# Patient Record
Sex: Female | Born: 1950 | Race: White | Marital: Married
Health system: Southern US, Community
[De-identification: ages and names within clinical notes are randomized; demographics above are authoritative.]

---

## 2018-07-05 ENCOUNTER — Ambulatory Visit (HOSPITAL_COMMUNITY): Payer: Medicare Other | Attending: Orthopedic Surgery

## 2018-07-05 ENCOUNTER — Other Ambulatory Visit: Payer: Self-pay

## 2018-07-05 ENCOUNTER — Encounter (HOSPITAL_COMMUNITY): Payer: Self-pay

## 2018-07-05 DIAGNOSIS — M25661 Stiffness of right knee, not elsewhere classified: Secondary | ICD-10-CM | POA: Diagnosis present

## 2018-07-05 DIAGNOSIS — M6281 Muscle weakness (generalized): Secondary | ICD-10-CM | POA: Diagnosis present

## 2018-07-05 DIAGNOSIS — R262 Difficulty in walking, not elsewhere classified: Secondary | ICD-10-CM | POA: Diagnosis present

## 2018-07-05 DIAGNOSIS — R600 Localized edema: Secondary | ICD-10-CM | POA: Diagnosis present

## 2018-07-05 NOTE — Patient Instructions (Signed)
Access Code: [email or web address]  URL: [email or web address]  Date: 07/05/2018  Prepared by: Jac Canavan   Exercises Supine Quadricep Sets - 10 reps - 3 sets - 5-10seconds hold - 1-2x daily - 7x weekly Supine Heel Slide with Strap - 10 reps - 3 sets - 5-10seconds hold                            - 1-2x daily - 7x weekly

## 2018-07-05 NOTE — Therapy (Signed)
[facility] [address] [place], [place], [address] Phone: [phone]   Fax:  [phone]  Physical Therapy Evaluation  Patient Details  Name: Angela Larsen MRN: [id number] Date of Birth: [birth year] Referring Provider (PT): Sherlyn Lick, MD   Encounter Date: 07/05/2018  PT End of Session - 07/05/18 1606    Visit Number  1    Number of Visits  19    Date for PT Re-Evaluation  08/16/18   mini reassess 07/26/18   Authorization Type  [employer] Medicare    Authorization Time Period  07/05/18 to 08/16/18    Authorization - Visit Number  1    Authorization - Number of Visits  10    PT Start Time  1430    PT Stop Time  1510    PT Time Calculation (min)  40 min    Activity Tolerance  Patient tolerated treatment well    Behavior During Therapy  [facility] for tasks assessed/performed       History reviewed. No pertinent past medical history.  History reviewed. No pertinent surgical history.  There were no vitals filed for this visit.   Subjective Assessment - 07/05/18 1436    Subjective  Pt reports undergoing R TKA on 06/06/18. She had HHPT which ended on 06/30/18. She reports using a RW since August s/p fall at church, but prior to that she was not using an AD for ambulation. She reports having the most difficulty with the stiffness. She states that it is very tight in her thing and at the knee joint. Her pain is pretty well-managed. She has difficulty sitting and standing for long periods of time becuase it will get weak on her.     Limitations  Walking;Standing;Sitting    How long can you sit comfortably?  30-40 mins    How long can you stand comfortably?  not very long    How long can you walk comfortably?  ~10 mins    Currently in Pain?  No/denies         [facility] PT Assessment - 07/05/18 0001      Assessment   Medical Diagnosis  R TKA    Referring Provider (PT)  Sherlyn Lick, MD    Onset Date/Surgical Date  06/06/18    Next MD Visit   09/08/2018    Prior Therapy  HHPT ended on 06/30/18      Balance Screen   Has the patient fallen in the past 6 months  Yes    How many times?  1   when R knee gave out and she fell at church   Has the patient had a decrease in activity level because of a fear of falling?   No    Is the patient reluctant to leave their home because of a fear of falling?   No      Prior Function   Level of Independence  Independent    Vocation  Retired    Leisure  word finds, church      Observation/Other Assessments   Focus on Therapeutic Outcomes (FOTO)   42% limitation      Observation/Other Assessments-Edema    Edema  Circumferential      Circumferential Edema   Circumferential - Right  45.5cm, joint line    Circumferential - Left   41.5cm, joint line      ROM / Strength   AROM / PROM / Strength  AROM;Strength      AROM  AROM Assessment Site  Knee    Right/Left Knee  Right    Right Knee Extension  9    Right Knee Flexion  96      Strength   Strength Assessment Site  Hip;Knee;Ankle    Right Hip Flexion  3-/5    Right Hip Extension  4-/5   based on functional mobility   Right Hip ABduction  2+/5    Left Hip Flexion  4-/5    Left Hip Extension  4-/5   based on functional assessment   Left Hip ABduction  3+/5    Right Knee Flexion  4/5   sitting   Right Knee Extension  3-/5    Left Knee Flexion  5/5   sitting   Left Knee Extension  4/5    Right Ankle Dorsiflexion  4/5    Left Ankle Dorsiflexion  4+/5      Palpation   Patella mobility  sup/inf hypomobile    Palpation comment  increased restrictions and tenderness along scar and distal quad      Ambulation/Gait   Ambulation Distance (Feet)  426 Feet     Assistive device  Rolling walker    Gait Pattern  Step-through pattern;Decreased stance time - right;Decreased step length - left;Decreased hip/knee flexion - right;Decreased dorsiflexion - right;Antalgic;Trendelenburg      Balance   Balance Assessed  Yes      Static  Standing Balance   Static Standing - Balance Support  No upper extremity supported    Static Standing Balance -  Activities   Single Leg Stance - Right Leg;Single Leg Stance - Left Leg    Static Standing - Comment/# of Minutes  R: 0sec L:4sec or <      Standardized Balance Assessment   Standardized Balance Assessment  Five Times Sit to Stand    Five times sit to stand comments   25sec, 2 bouts of R knee buckle           Objective measurements completed on examination: See above findings.        PT Education - 07/05/18 1606    Education Details  exam findings, POC, HEP    Person(s) Educated  Patient;Spouse    Methods  Explanation;Demonstration;Handout    Comprehension  Verbalized understanding       PT Short Term Goals - 07/05/18 1620      PT SHORT TERM GOAL #1   Title  Pt will have decreased R knee edema at joint line by 2cm or > to decrease pain and improve ROM.    Time  3    Period  Weeks    Status  New    Target Date  07/26/18      PT SHORT TERM GOAL #2   Title  Pt will have improved R knee AROM from 5-105deg in order to maximize gait and reduce pain.    Time  3    Period  Weeks    Status  New      PT SHORT TERM GOAL #4   Title  Pt will be able to perform bil SLS for 5 sec without UE support to demo improved functional strength and to maximize gait.    Time  6    Period  Weeks    Status  New      PT SHORT TERM GOAL #5   Title  Pt will be able to perform 5xSTS in 15sec or < without UE support and without R  knee buckling to demo improved balance and functional strength.    Time  3    Period  Weeks    Status  New        PT Long Term Goals - 07/05/18 1620      PT LONG TERM GOAL #1   Title  Pt will have improved R knee AROM from 0-115deg in order to further maximize gait and stair ambulation.     Time  6    Period  Weeks    Status  New    Target Date  08/16/18      PT LONG TERM GOAL #2   Title  Pt will have improved MMT to at least 4/5 or better  throughout in order to maximize gait and balance.     Time  6    Period  Weeks    Status  New      PT LONG TERM GOAL #3   Title  Pt will be able to perform bil SLS for 10sec or > without UE support to further demo improved functional strength to maximize gait on uneven ground and stair ambulation.     Time  6    Period  Weeks    Status  New      PT LONG TERM GOAL #4   Title  Pt will have 151ft improvement in with LRAD and gait WFL in order to demo improved functional strength and mobility in order to maximize community ambulation, access, and promote return to PLOF.     Time  6    Period  Weeks    Status  New             Plan - 07/05/18 1607    Clinical Impression Statement  Pt is pleasant 67YO F who presents to OPPT s/p R TKA by Dr. Sherlyn Lick on 06/06/18. She was d/c from HHPT on 06/30/18. She currently present with post-op deficits in edema, ROM, MMT, functional strength, balance, gait, and functional mobility. Pt with 2 episodes of R knee buckling during 5xSTS but she was able to maintain balance independently. She was noted to have ~4cm swelling joint line. Her AROM was 9-96deg this date. Scar appeared well-healing with a few scabs along scar, but no overt signs of infection. Pt needs skilled PT intervention to address these impairments in order to improve ROM and maximize return to PLOF.     History and Personal Factors relevant to plan of care:  h/o L TKA 01/2017    Clinical Presentation  Stable    Clinical Presentation due to:  see flowsheets for objective tests and measures and their outcomes    Clinical Decision Making  Low    Rehab Potential  Good    PT Frequency  3x / week    PT Duration  6 weeks    PT Treatment/Interventions  ADLs/Self Care Home Management;Aquatic Therapy;Cryotherapy;Electrical Stimulation;Moist Heat;Ultrasound;DME Instruction;Gait training;Stair training;Functional mobility training;Therapeutic activities;Therapeutic exercise;Balance  training;Neuromuscular re-education;Patient/family education;Manual techniques;Scar mobilization;Passive range of motion;Dry needling;Energy conservation;Taping;Spinal Manipulations;Joint Manipulations    PT Next Visit Plan  review goals, initially focus on edema and ROM prior to heavy strengthening    PT Home Exercise Plan  eval: quad sets, heel slides    Consulted and Agree with Plan of Care  Patient;Family member/caregiver    Family Member Consulted  husband       Patient will benefit from skilled therapeutic intervention in order to improve the following deficits and impairments:  Decreased activity  tolerance, Abnormal gait, Decreased balance, Decreased endurance, Decreased mobility, Decreased range of motion, Decreased scar mobility, Decreased strength, Difficulty walking, Hypomobility, Increased edema, Increased fascial restricitons, Increased muscle spasms, Impaired flexibility, Improper body mechanics, Pain  Visit Diagnosis: Stiffness of right knee, not elsewhere classified  Muscle weakness (generalized)  Localized edema  Difficulty in walking, not elsewhere classified     Problem List There are no active problems to display for this patient.      Jac Canavan PT, DPT  [facility] [address] [place], [place], [address] Phone: [phone]   Fax:  [phone]  Name: KAMBREA CARRASCO MRN: [id number] Date of Birth: 03-Dec-1950

## 2018-07-06 ENCOUNTER — Encounter (HOSPITAL_COMMUNITY): Payer: Self-pay

## 2018-07-06 ENCOUNTER — Ambulatory Visit (HOSPITAL_COMMUNITY): Payer: Medicare Other

## 2018-07-06 DIAGNOSIS — R262 Difficulty in walking, not elsewhere classified: Secondary | ICD-10-CM

## 2018-07-06 DIAGNOSIS — M6281 Muscle weakness (generalized): Secondary | ICD-10-CM

## 2018-07-06 DIAGNOSIS — R600 Localized edema: Secondary | ICD-10-CM

## 2018-07-06 DIAGNOSIS — M25661 Stiffness of right knee, not elsewhere classified: Secondary | ICD-10-CM

## 2018-07-06 NOTE — Addendum Note (Signed)
Addended by: Jerl Santos on: 07/06/2018 04:16 PM   Modules accepted: Orders

## 2018-07-06 NOTE — Therapy (Signed)
[facility] [address] [place], [place], [address] Phone: [phone]   Fax:  [phone]  Physical Therapy Treatment  Patient Details  Name: Angela Larsen MRN: [id number] Date of Birth: 1950/12/21 Referring Provider (PT): Sherlyn Lick, MD   Encounter Date: 07/06/2018  PT End of Session - 07/06/18 1258    Visit Number  2    Number of Visits  19    Date for PT Re-Evaluation  08/16/18   mini reassess 07/26/18   Authorization Type  [employer]HC Medicare    Authorization Time Period  07/05/18 to 08/16/18    Authorization - Visit Number  2    Authorization - Number of Visits  10    PT Start Time  1300    PT Stop Time  1342    PT Time Calculation (min)  42 min    Activity Tolerance  Patient tolerated treatment well    Behavior During Therapy  [facility] for tasks assessed/performed       History reviewed. No pertinent past medical history.  History reviewed. No pertinent surgical history.  There were no vitals filed for this visit.  Subjective Assessment - 07/06/18 1259    Subjective  Pt reports compliance with her HEP 2x yesterday. She states that she is not in any pain right now.     Limitations  Walking;Standing;Sitting    How long can you sit comfortably?  30-40 mins    How long can you stand comfortably?  not very long    How long can you walk comfortably?  ~10 mins    Currently in Pain?  No/denies            [facility] Adult PT Treatment/Exercise - 07/06/18 0001      Exercises   Exercises  Knee/Hip      Knee/Hip Exercises: Stretches   Passive Hamstring Stretch  Left;3 reps;30 seconds    Passive Hamstring Stretch Limitations  supine      Knee/Hip Exercises: Seated   Long Arc Quad  Right;2 sets;10 reps    Heel Slides  Right;10 reps    Heel Slides Limitations  3-5" holds      Knee/Hip Exercises: Supine   Quad Sets  Right;15 reps    Quad Sets Limitations  3" holds    Short Arc Quad Sets  Right;2 sets;10 reps    Short Arc Quad  Sets Limitations  3" holds    Heel Slides  Right;15 reps    Heel Slides Limitations  3" holds    Knee Extension Limitations  5    Knee Flexion Limitations  103      Manual Therapy   Manual Therapy  Edema management    Manual therapy comments  separate rest of treatment    Edema Management  retro massage BLE elevated +ankle pumps for reduced edema            PT Education - 07/06/18 1259    Education Details  reviewed goals, exercise technique, continue HEP    Person(s) Educated  Patient;Spouse    Methods  Explanation;Demonstration    Comprehension  Verbalized understanding;Returned demonstration       PT Short Term Goals - 07/05/18 1620      PT SHORT TERM GOAL #1   Title  Pt will have decreased R knee edema at joint line by 2cm or > to decrease pain and improve ROM.    Time  3    Period  Weeks  Status  New    Target Date  07/26/18      PT SHORT TERM GOAL #2   Title  Pt will have improved R knee AROM from 5-105deg in order to maximize gait and reduce pain.    Time  3    Period  Weeks    Status  New      PT SHORT TERM GOAL #4   Title  Pt will be able to perform bil SLS for 5 sec without UE support to demo improved functional strength and to maximize gait.    Time  6    Period  Weeks    Status  New      PT SHORT TERM GOAL #5   Title  Pt will be able to perform 5xSTS in 15sec or < without UE support and without R knee buckling to demo improved balance and functional strength.    Time  3    Period  Weeks    Status  New        PT Long Term Goals - 07/05/18 1620      PT LONG TERM GOAL #1   Title  Pt will have improved R knee AROM from 0-115deg in order to further maximize gait and stair ambulation.     Time  6    Period  Weeks    Status  New    Target Date  08/16/18      PT LONG TERM GOAL #2   Title  Pt will have improved MMT to at least 4/5 or better throughout in order to maximize gait and balance.     Time  6    Period  Weeks    Status  New      PT  LONG TERM GOAL #3   Title  Pt will be able to perform bil SLS for 10sec or > without UE support to further demo improved functional strength to maximize gait on uneven ground and stair ambulation.     Time  6    Period  Weeks    Status  New      PT LONG TERM GOAL #4   Title  Pt will have 18ft improvement in with LRAD and gait WFL in order to demo improved functional strength and mobility in order to maximize community ambulation, access, and promote return to PLOF.     Time  6    Period  Weeks    Status  New            Plan - 07/06/18 1343    Clinical Impression Statement  Began session by reviewing goals with no f/u questions afterwards. Rest of session focused on ROM and reducing edema. Min cues for therex but overall, pt tolerating well with no increases in pain. She was challenged with SAQ indicating increased quad weakness. AROM 5 to 103deg. Continue as planned, progressing as able.     Rehab Potential  Good    PT Frequency  3x / week    PT Duration  6 weeks    PT Treatment/Interventions  ADLs/Self Care Home Management;Aquatic Therapy;Cryotherapy;Electrical Stimulation;Moist Heat;Ultrasound;DME Instruction;Gait training;Stair training;Functional mobility training;Therapeutic activities;Therapeutic exercise;Balance training;Neuromuscular re-education;Patient/family education;Manual techniques;Scar mobilization;Passive range of motion;Dry needling;Energy conservation;Taping;Spinal Manipulations;Joint Manipulations    PT Next Visit Plan  continue focus on edema and ROM prior to heavy strengthening    PT Home Exercise Plan  eval: quad sets, heel slides    Consulted and Agree with Plan of Care  Patient;Family  member/caregiver    Family Member Consulted  husband       Patient will benefit from skilled therapeutic intervention in order to improve the following deficits and impairments:  Decreased activity tolerance, Abnormal gait, Decreased balance, Decreased endurance, Decreased  mobility, Decreased range of motion, Decreased scar mobility, Decreased strength, Difficulty walking, Hypomobility, Increased edema, Increased fascial restricitons, Increased muscle spasms, Impaired flexibility, Improper body mechanics, Pain  Visit Diagnosis: Stiffness of right knee, not elsewhere classified  Muscle weakness (generalized)  Localized edema  Difficulty in walking, not elsewhere classified     Problem List There are no active problems to display for this patient.       Jac Canavan PT, DPT  [facility] [address] [place], [place], [address] Phone: [phone]   Fax:  [phone]  Name: Angela Larsen MRN: [id number] Date of Birth: [birth year]

## 2018-07-11 ENCOUNTER — Encounter (HOSPITAL_COMMUNITY): Payer: Self-pay

## 2018-07-11 ENCOUNTER — Ambulatory Visit (HOSPITAL_COMMUNITY): Payer: Medicare Other | Attending: Orthopedic Surgery

## 2018-07-11 DIAGNOSIS — R262 Difficulty in walking, not elsewhere classified: Secondary | ICD-10-CM | POA: Diagnosis present

## 2018-07-11 DIAGNOSIS — M25661 Stiffness of right knee, not elsewhere classified: Secondary | ICD-10-CM | POA: Insufficient documentation

## 2018-07-11 DIAGNOSIS — R600 Localized edema: Secondary | ICD-10-CM | POA: Diagnosis present

## 2018-07-11 DIAGNOSIS — M6281 Muscle weakness (generalized): Secondary | ICD-10-CM | POA: Insufficient documentation

## 2018-07-11 NOTE — Therapy (Signed)
[facility] [address] [place], [place], [address] Phone: [phone]   Fax:  [phone]  Physical Therapy Treatment  Patient Details  Name: Angela Larsen MRN: [id number] Date of Birth: Sep 14, 1950 Referring Provider (PT): Sherlyn Lick, MD   Encounter Date: 07/11/2018  PT End of Session - 07/11/18 1440    Visit Number  3    Number of Visits  19    Date for PT Re-Evaluation  08/16/18   Minireassess 07/26/18   Authorization Type  [employer]HC Medicare    Authorization Time Period  07/05/18 to 08/16/18    Authorization - Visit Number  3    Authorization - Number of Visits  10    PT Start Time  1430   3' on bike, not included wiht charges   PT Stop Time  1512    PT Time Calculation (min)  42 min    Activity Tolerance  Patient tolerated treatment well;No increased pain    Behavior During Therapy  [facility] for tasks assessed/performed       History reviewed. No pertinent past medical history.  History reviewed. No pertinent surgical history.  There were no vitals filed for this visit.  Subjective Assessment - 07/11/18 1434    Subjective  Pt stated knee is feeling good today, no reports of pain just stiffness.  Continues to have swelling and stiffness.  Reports compliance iwth HEP daily.    Currently in Pain?  No/denies                       [facility] Adult PT Treatment/Exercise - 07/11/18 0001      Exercises   Exercises  Knee/Hip      Knee/Hip Exercises: Stretches   Passive Hamstring Stretch  Left;3 reps;30 seconds    Passive Hamstring Stretch Limitations  supine    Knee: Self-Stretch to increase Flexion  5 reps;10 seconds    Knee: Self-Stretch Limitations  knee drive on 8in step 5x 10" holds      Knee/Hip Exercises: Aerobic   Stationary Bike  3' full revolution seat 9      Knee/Hip Exercises: Seated   Long Arc Quad  Right;15 reps    Long Arc Quad Limitations  3-5" holds    Sit to [employer]  without UE support;10 reps       Knee/Hip Exercises: Supine   [employer]  Right;15 reps    [employer] Limitations  3" holds    Short Arc [employer]  15 reps    Short Arc Quad Sets Limitations  5" holds    Heel Slides  Right;10 reps    Straight Leg Raises  AAROM;Right;10 reps    Straight Leg Raises Limitations  AA for extension lag    Knee Extension  AROM    Knee Extension Limitations  5    Knee Flexion  AROM    Knee Flexion Limitations  110   was 103     Manual Therapy   Manual Therapy  Edema management    Manual therapy comments  separate rest of treatment    Edema Management  retro massage BLE elevated +ankle pumps for reduced edema               PT Short Term Goals - 07/05/18 1620      PT SHORT TERM GOAL #1   Title  Pt will have decreased R knee edema at joint line by 2cm or > to decrease  pain and improve ROM.    Time  3    Period  Weeks    Status  New    Target Date  07/26/18      PT SHORT TERM GOAL #2   Title  Pt will have improved R knee AROM from 5-105deg in order to maximize gait and reduce pain.    Time  3    Period  Weeks    Status  New      PT SHORT TERM GOAL #4   Title  Pt will be able to perform bil SLS for 5 sec without UE support to demo improved functional strength and to maximize gait.    Time  6    Period  Weeks    Status  New      PT SHORT TERM GOAL #5   Title  Pt will be able to perform 5xSTS in 15sec or < without UE support and without R knee buckling to demo improved balance and functional strength.    Time  3    Period  Weeks    Status  New        PT Long Term Goals - 07/05/18 1620      PT LONG TERM GOAL #1   Title  Pt will have improved R knee AROM from 0-115deg in order to further maximize gait and stair ambulation.     Time  6    Period  Weeks    Status  New    Target Date  08/16/18      PT LONG TERM GOAL #2   Title  Pt will have improved MMT to at least 4/5 or better throughout in order to maximize gait and balance.     Time  6    Period  Weeks    Status   New      PT LONG TERM GOAL #3   Title  Pt will be able to perform bil SLS for 10sec or > without UE support to further demo improved functional strength to maximize gait on uneven ground and stair ambulation.     Time  6    Period  Weeks    Status  New      PT LONG TERM GOAL #4   Title  Pt will have 130ft improvement in with LRAD and gait WFL in order to demo improved functional strength and mobility in order to maximize community ambulation, access, and promote return to PLOF.     Time  6    Period  Weeks    Status  New            Plan - 07/11/18 1514    Clinical Impression Statement  Session focus on knee mobility.  Added bike with ability to make full revolution seat 9 and knee drives for flexion.  Improved AROM 5-110 degrees (was 5-103 last session).  Pt continues to exhibit edema proximal knee and weakness with quadricep contraction.  Pt educated on benefits of compliance with HEP for maximal benefits.  Min cueing to improve quad sets, improved seated vs supine.  Added SLR for quad strengthening with active assist required per extension lag.  Pt educated on benefits of ice with elevation wiht edema control and discussion held iwht compression hose as well.  No reports of increased pain through session.      Rehab Potential  Good    PT Frequency  3x / week    PT Duration  6 weeks  PT Treatment/Interventions  ADLs/Self Care Home Management;Aquatic Therapy;Cryotherapy;Electrical Stimulation;Moist Heat;Ultrasound;DME Instruction;Gait training;Stair training;Functional mobility training;Therapeutic activities;Therapeutic exercise;Balance training;Neuromuscular re-education;Patient/family education;Manual techniques;Scar mobilization;Passive range of motion;Dry needling;Energy conservation;Taping;Spinal Manipulations;Joint Manipulations    PT Next Visit Plan  continue focus on edema and ROM prior to heavy strengthening    PT Home Exercise Plan  eval: quad sets, heel slides        Patient will benefit from skilled therapeutic intervention in order to improve the following deficits and impairments:  Decreased activity tolerance, Abnormal gait, Decreased balance, Decreased endurance, Decreased mobility, Decreased range of motion, Decreased scar mobility, Decreased strength, Difficulty walking, Hypomobility, Increased edema, Increased fascial restricitons, Increased muscle spasms, Impaired flexibility, Improper body mechanics, Pain  Visit Diagnosis: Stiffness of right knee, not elsewhere classified  Muscle weakness (generalized)  Localized edema  Difficulty in walking, not elsewhere classified     Problem List There are no active problems to display for this patient.  [address], LPTA; CBIS [phone]  Juel Burrow 07/11/2018, 7:00 PM  [facility] [address] [place], [place], [address] Phone: [phone]   Fax:  [phone]  Name: GIORGIA WAHLER MRN: [id number] Date of Birth: 1951-02-14

## 2018-07-12 ENCOUNTER — Ambulatory Visit (HOSPITAL_COMMUNITY): Payer: Medicare Other

## 2018-07-12 ENCOUNTER — Encounter (HOSPITAL_COMMUNITY): Payer: Self-pay

## 2018-07-12 DIAGNOSIS — M6281 Muscle weakness (generalized): Secondary | ICD-10-CM

## 2018-07-12 DIAGNOSIS — R262 Difficulty in walking, not elsewhere classified: Secondary | ICD-10-CM

## 2018-07-12 DIAGNOSIS — M25661 Stiffness of right knee, not elsewhere classified: Secondary | ICD-10-CM

## 2018-07-12 DIAGNOSIS — R600 Localized edema: Secondary | ICD-10-CM

## 2018-07-12 NOTE — Therapy (Signed)
[facility] [address] [place], [place], [address] Phone: [phone]   Fax:  [phone]  Physical Therapy Treatment  Patient Details  Name: Angela Larsen MRN: [id number] Date of Birth: 1951-03-14 Referring Provider (PT): Sherlyn Lick, MD   Encounter Date: 07/12/2018  PT End of Session - 07/12/18 1256    Visit Number  4    Number of Visits  19    Date for PT Re-Evaluation  08/16/18   Minireassess 07/26/18   Authorization Type  [employer] Medicare    Authorization Time Period  07/05/18 to 08/16/18    Authorization - Visit Number  4    Authorization - Number of Visits  10    PT Start Time  1258    PT Stop Time  1340    PT Time Calculation (min)  42 min    Activity Tolerance  Patient tolerated treatment well;No increased pain    Behavior During Therapy  [facility] for tasks assessed/performed       History reviewed. No pertinent past medical history.  History reviewed. No pertinent surgical history.  There were no vitals filed for this visit.  Subjective Assessment - 07/12/18 1257    Subjective  Pt reports that she's doing well. No pain today.     Currently in Pain?  No/denies           [facility] Adult PT Treatment/Exercise - 07/12/18 0001      Exercises   Exercises  Knee/Hip      Knee/Hip Exercises: Stretches   Passive Hamstring Stretch  Left;3 reps;30 seconds    Passive Hamstring Stretch Limitations  standing, 12" step    Knee: Self-Stretch to increase Flexion  Right    Knee: Self-Stretch Limitations  10x10" holds, standing 12" step    Gastroc Stretch  Both;3 reps;30 seconds    Gastroc Stretch Limitations  slant board      Knee/Hip Exercises: Aerobic   Stationary Bike  x3 mins, seat 8, fwd revolution for mobility      Knee/Hip Exercises: Standing   Heel Raises  Both;15 reps    Heel Raises Limitations  heel and toe    Knee Flexion  Right;15 reps    Knee Flexion Limitations  cues to reduce hip flexion    Terminal Knee  Extension  Right;10 reps    Theraband Level (Terminal Knee Extension)  Level 2 (Red)    Terminal Knee Extension Limitations  5" holds    Rocker Board  2 minutes    Rocker Board Limitations  R/L    Gait Training  155ft with SPC      Knee/Hip Exercises: Seated   Long Arc Quad  Right;15 reps    Long Arc Quad Limitations  3-5" holds    Sit to [employer]  10 reps;without UE support      Knee/Hip Exercises: Supine   [employer]  Right;15 reps    [employer] Limitations  3" holds    Short Arc [employer]  15 reps    Short Arc Quad Sets Limitations  5" holds    Knee Extension Limitations  5    Knee Flexion Limitations  113      Manual Therapy   Manual Therapy  Edema management;Soft tissue mobilization    Manual therapy comments  separate rest of treatment    Edema Management  retro massage BLE elevated +ankle pumps for reduced edema    Soft tissue mobilization  to proximal scar and VMO  to reduce restrictions             PT Education - 07/12/18 1256    Education Details  exercise technique, continue HEP    Person(s) Educated  Patient    Methods  Explanation;Demonstration    Comprehension  Verbalized understanding;Returned demonstration       PT Short Term Goals - 07/05/18 1620      PT SHORT TERM GOAL #1   Title  Pt will have decreased R knee edema at joint line by 2cm or > to decrease pain and improve ROM.    Time  3    Period  Weeks    Status  New    Target Date  07/26/18      PT SHORT TERM GOAL #2   Title  Pt will have improved R knee AROM from 5-105deg in order to maximize gait and reduce pain.    Time  3    Period  Weeks    Status  New      PT SHORT TERM GOAL #4   Title  Pt will be able to perform bil SLS for 5 sec without UE support to demo improved functional strength and to maximize gait.    Time  6    Period  Weeks    Status  New      PT SHORT TERM GOAL #5   Title  Pt will be able to perform 5xSTS in 15sec or < without UE support and without R knee buckling to demo  improved balance and functional strength.    Time  3    Period  Weeks    Status  New        PT Long Term Goals - 07/05/18 1620      PT LONG TERM GOAL #1   Title  Pt will have improved R knee AROM from 0-115deg in order to further maximize gait and stair ambulation.     Time  6    Period  Weeks    Status  New    Target Date  08/16/18      PT LONG TERM GOAL #2   Title  Pt will have improved MMT to at least 4/5 or better throughout in order to maximize gait and balance.     Time  6    Period  Weeks    Status  New      PT LONG TERM GOAL #3   Title  Pt will be able to perform bil SLS for 10sec or > without UE support to further demo improved functional strength to maximize gait on uneven ground and stair ambulation.     Time  6    Period  Weeks    Status  New      PT LONG TERM GOAL #4   Title  Pt will have 131ft improvement in with LRAD and gait WFL in order to demo improved functional strength and mobility in order to maximize community ambulation, access, and promote return to PLOF.     Time  6    Period  Weeks    Status  New            Plan - 07/12/18 1341    Clinical Impression Statement  Continued with established POC focusing on ROM and edema. Began session on bike for knee mobility and then initiated gait with SPC this date. She was noted to have slight antalgia and mild gait deviations but she stated this  could be due to her R hip pain not knee pain; educated her to continue with RW for now but we will continue to work on this in therapy. Progressed her to more standing ROM activities this date, all with good tolerance, just min cues for technique. She did report x2 occurrences of her knee wanting to buckle on her but she maintained balance independently. Ended with manual for edema in order to improve ROM. AROM 5 to 113deg this date. Continue as planned, progressing as able.     Rehab Potential  Good    PT Frequency  3x / week    PT Duration  6 weeks    PT  Treatment/Interventions  ADLs/Self Care Home Management;Aquatic Therapy;Cryotherapy;Electrical Stimulation;Moist Heat;Ultrasound;DME Instruction;Gait training;Stair training;Functional mobility training;Therapeutic activities;Therapeutic exercise;Balance training;Neuromuscular re-education;Patient/family education;Manual techniques;Scar mobilization;Passive range of motion;Dry needling;Energy conservation;Taping;Spinal Manipulations;Joint Manipulations    PT Next Visit Plan  continue focus on edema and ROM prior to heavy strengthening; continue STM for restrictions and gait with SPC    PT Home Exercise Plan  eval: quad sets, heel slides    Consulted and Agree with Plan of Care  Patient       Patient will benefit from skilled therapeutic intervention in order to improve the following deficits and impairments:  Decreased activity tolerance, Abnormal gait, Decreased balance, Decreased endurance, Decreased mobility, Decreased range of motion, Decreased scar mobility, Decreased strength, Difficulty walking, Hypomobility, Increased edema, Increased fascial restricitons, Increased muscle spasms, Impaired flexibility, Improper body mechanics, Pain  Visit Diagnosis: Stiffness of right knee, not elsewhere classified  Muscle weakness (generalized)  Localized edema  Difficulty in walking, not elsewhere classified     Problem List There are no active problems to display for this patient.      Jac Canavan PT, DPT  [facility] [address] [place], [place], [address] Phone: [phone]   Fax:  [phone]  Name: Angela Larsen MRN: [id number] Date of Birth: 1951/01/15

## 2018-07-14 ENCOUNTER — Encounter (HOSPITAL_COMMUNITY): Payer: Self-pay

## 2018-07-14 ENCOUNTER — Ambulatory Visit (HOSPITAL_COMMUNITY): Payer: Medicare Other

## 2018-07-14 DIAGNOSIS — R600 Localized edema: Secondary | ICD-10-CM

## 2018-07-14 DIAGNOSIS — M25661 Stiffness of right knee, not elsewhere classified: Secondary | ICD-10-CM

## 2018-07-14 DIAGNOSIS — R262 Difficulty in walking, not elsewhere classified: Secondary | ICD-10-CM

## 2018-07-14 DIAGNOSIS — M6281 Muscle weakness (generalized): Secondary | ICD-10-CM

## 2018-07-14 NOTE — Therapy (Signed)
[facility] [address] [place], [place], [address] Phone: [phone]   Fax:  [phone]  Physical Therapy Treatment  Patient Details  Name: Angela Larsen MRN: [id number] Date of Birth: 05/09/1951 Referring Provider (PT): Sherlyn Lick, MD   Encounter Date: 07/14/2018  PT End of Session - 07/14/18 1521    Visit Number  5    Number of Visits  19    Date for PT Re-Evaluation  08/16/18   Minireassess 07/26/18   Authorization Type  [employer] Medicare    Authorization Time Period  07/05/18 to 08/16/18    Authorization - Visit Number  5    Authorization - Number of Visits  10    PT Start Time  1518   3' on bike, not included wiht charges   PT Stop Time  1600    PT Time Calculation (min)  42 min    Activity Tolerance  Patient tolerated treatment well;No increased pain    Behavior During Therapy  [facility] for tasks assessed/performed       History reviewed. No pertinent past medical history.  History reviewed. No pertinent surgical history.  There were no vitals filed for this visit.  Subjective Assessment - 07/14/18 1521    Subjective  Pt stated she is feeling good today, no reports of pain today.  Has been practicing wiht SPC around the home, continues wiht RW outside    Currently in Pain?  No/denies                       [facility] Adult PT Treatment/Exercise - 07/14/18 0001      Exercises   Exercises  Knee/Hip      Knee/Hip Exercises: Stretches   Active Hamstring Stretch  3 reps;30 seconds;Right    Active Hamstring Stretch Limitations  supine wiht rope    Knee: Self-Stretch to increase Flexion  Right    Knee: Self-Stretch Limitations  10x10" holds, standing 12" step    Gastroc Stretch  Both;3 reps;30 seconds    Gastroc Stretch Limitations  slant board      Knee/Hip Exercises: Aerobic   Stationary Bike  x3 mins, seat 8, fwd revolution for mobility      Knee/Hip Exercises: Standing   Heel Raises  Both;20 reps    Heel  Raises Limitations  heel and toe    Knee Flexion  Right;15 reps    Knee Flexion Limitations  cues to reduce hip flexion    Terminal Knee Extension  Right;15 reps    Theraband Level (Terminal Knee Extension)  Level 2 (Red)    Terminal Knee Extension Limitations  5" holds    Rocker Board  2 minutes    Rocker Board Limitations  lateral and Df/Pf    Gait Training  230ft with SPC      Knee/Hip Exercises: Seated   Long Arc Quad  Right;15 reps    Long Arc Quad Limitations  3-5" holds    Sit to [employer]  10 reps;without UE support      Knee/Hip Exercises: Supine   [employer]  Right;15 reps    [employer] Limitations  5" holds    Short Arc [employer]  20 reps    Short Arc Quad Sets Limitations  5" holds    Heel Slides  Right;10 reps    Heel Slides Limitations  3" holds    Terminal Knee Extension  AAROM;Right;1 set;10 reps    Terminal Knee Extension  Limitations  5" holds    Knee Extension  AROM    Knee Extension Limitations  5    Knee Flexion  AROM    Knee Flexion Limitations  115   was 113     Manual Therapy   Manual Therapy  Edema management    Manual therapy comments  separate rest of treatment    Edema Management  retro massage BLE elevated +ankle pumps for reduced edema               PT Short Term Goals - 07/05/18 1620      PT SHORT TERM GOAL #1   Title  Pt will have decreased R knee edema at joint line by 2cm or > to decrease pain and improve ROM.    Time  3    Period  Weeks    Status  New    Target Date  07/26/18      PT SHORT TERM GOAL #2   Title  Pt will have improved R knee AROM from 5-105deg in order to maximize gait and reduce pain.    Time  3    Period  Weeks    Status  New      PT SHORT TERM GOAL #4   Title  Pt will be able to perform bil SLS for 5 sec without UE support to demo improved functional strength and to maximize gait.    Time  6    Period  Weeks    Status  New      PT SHORT TERM GOAL #5   Title  Pt will be able to perform 5xSTS in 15sec or <  without UE support and without R knee buckling to demo improved balance and functional strength.    Time  3    Period  Weeks    Status  New        PT Long Term Goals - 07/05/18 1620      PT LONG TERM GOAL #1   Title  Pt will have improved R knee AROM from 0-115deg in order to further maximize gait and stair ambulation.     Time  6    Period  Weeks    Status  New    Target Date  08/16/18      PT LONG TERM GOAL #2   Title  Pt will have improved MMT to at least 4/5 or better throughout in order to maximize gait and balance.     Time  6    Period  Weeks    Status  New      PT LONG TERM GOAL #3   Title  Pt will be able to perform bil SLS for 10sec or > without UE support to further demo improved functional strength to maximize gait on uneven ground and stair ambulation.     Time  6    Period  Weeks    Status  New      PT LONG TERM GOAL #4   Title  Pt will have 144ft improvement in with LRAD and gait WFL in order to demo improved functional strength and mobility in order to maximize community ambulation, access, and promote return to PLOF.     Time  6    Period  Weeks    Status  New            Plan - 07/14/18 1659    Clinical Impression Statement  Continued with established POC focusing  on knee mobility and edema.  Pt ambulated with SPC wiht good mechanics noted, encouraged to continue wiht RW outdoors and long duration but SPC indoors.  Added supine TKE with mod A to improve distal quad contractions for strengthening (verbal and tactile cueing required).  Pt does continue to have edema present distal Rt LE, retro massage complete to assist iwht edema control to assist with ROM.  Improved AROM 5-115 degrees (was 5-113 degrees last session).  No reports of pain through session.    Rehab Potential  Good    PT Frequency  3x / week    PT Duration  6 weeks    PT Treatment/Interventions  ADLs/Self Care Home Management;Aquatic Therapy;Cryotherapy;Electrical Stimulation;Moist  Heat;Ultrasound;DME Instruction;Gait training;Stair training;Functional mobility training;Therapeutic activities;Therapeutic exercise;Balance training;Neuromuscular re-education;Patient/family education;Manual techniques;Scar mobilization;Passive range of motion;Dry needling;Energy conservation;Taping;Spinal Manipulations;Joint Manipulations    PT Next Visit Plan  continue focus on edema and ROM prior to heavy strengthening; continue STM for restrictions and gait with SPC    PT Home Exercise Plan  eval: quad sets, heel slides       Patient will benefit from skilled therapeutic intervention in order to improve the following deficits and impairments:  Decreased activity tolerance, Abnormal gait, Decreased balance, Decreased endurance, Decreased mobility, Decreased range of motion, Decreased scar mobility, Decreased strength, Difficulty walking, Hypomobility, Increased edema, Increased fascial restricitons, Increased muscle spasms, Impaired flexibility, Improper body mechanics, Pain  Visit Diagnosis: Stiffness of right knee, not elsewhere classified  Muscle weakness (generalized)  Localized edema  Difficulty in walking, not elsewhere classified     Problem List There are no active problems to display for this patient.  [address], LPTA; CBIS [phone]  Juel Burrow 07/14/2018, 5:07 PM  [facility] [address] [place], [place], [address] Phone: [phone]   Fax:  [phone]  Name: Angela Larsen MRN: [id number] Date of Birth: [birth year]

## 2018-07-17 ENCOUNTER — Ambulatory Visit (HOSPITAL_COMMUNITY): Payer: Medicare Other

## 2018-07-17 ENCOUNTER — Telehealth (HOSPITAL_COMMUNITY): Payer: Self-pay

## 2018-07-17 NOTE — Telephone Encounter (Signed)
Pt woke up sick this morning and she can not come in today, r/s for Thurs -pt will be here Wed, Thurs and Fri. NF 07/17/18

## 2018-07-19 ENCOUNTER — Encounter (HOSPITAL_COMMUNITY): Payer: Self-pay

## 2018-07-19 ENCOUNTER — Ambulatory Visit (HOSPITAL_COMMUNITY): Payer: Medicare Other

## 2018-07-19 DIAGNOSIS — M25661 Stiffness of right knee, not elsewhere classified: Secondary | ICD-10-CM | POA: Diagnosis not present

## 2018-07-19 DIAGNOSIS — R262 Difficulty in walking, not elsewhere classified: Secondary | ICD-10-CM

## 2018-07-19 DIAGNOSIS — R600 Localized edema: Secondary | ICD-10-CM

## 2018-07-19 DIAGNOSIS — M6281 Muscle weakness (generalized): Secondary | ICD-10-CM

## 2018-07-19 NOTE — Therapy (Signed)
[facility] [address] [place], [place], [address] Phone: [phone]   Fax:  [phone]  Physical Therapy Treatment  Patient Details  Name: Angela Larsen MRN: [id number] Date of Birth: 08/31/1951 Referring Provider (PT): Sherlyn Lick, MD # OF FEET WALKED: ambulated with [facility] through session. ROM:  Flexion: 117 degrees (was 96 degrees on 07/05/18)            Extension: 5 degrees (was 9 degrees on 07/05/18)   Encounter Date: 07/19/2018  PT End of Session - 07/19/18 1356    Visit Number  6    Number of Visits  19    Date for PT Re-Evaluation  08/16/18   Minireassess 07/26/18   Authorization Type  [employer] Medicare    Authorization Time Period  07/05/18 to 08/16/18    Authorization - Visit Number  6    Authorization - Number of Visits  10    PT Start Time  1300   3' on bike, not included with charges   PT Stop Time  1353    PT Time Calculation (min)  53 min    Activity Tolerance  Patient tolerated treatment well;No increased pain    Behavior During Therapy  [facility] for tasks assessed/performed       History reviewed. No pertinent past medical history.  History reviewed. No pertinent surgical history.  There were no vitals filed for this visit.  Subjective Assessment - 07/19/18 1305    Subjective  Pt stated she is feeling good today, no reports of pain.  Stated she has been walking more wiht SPC    Currently in Pain?  No/denies                       [facility] Adult PT Treatment/Exercise - 07/19/18 0001      Ambulation/Gait   Ambulation Distance (Feet)  226 Feet    Assistive device  Rolling walker    Gait Pattern  Step-through pattern;Decreased stance time - right;Decreased step length - left;Decreased hip/knee flexion - right;Decreased dorsiflexion - right;Antalgic;Trendelenburg      Exercises   Exercises  Knee/Hip      Knee/Hip Exercises: Stretches   Active Hamstring Stretch  3 reps;30 seconds;Right    Active  Hamstring Stretch Limitations  supine wiht rope    Knee: Self-Stretch to increase Flexion  Right    Knee: Self-Stretch Limitations  10x10" holds, standing 12" step    Gastroc Stretch  Both;3 reps;30 seconds    Gastroc Stretch Limitations  slant board      Knee/Hip Exercises: Aerobic   Stationary Bike  x3 mins, seat 8, fwd revolution for mobility      Knee/Hip Exercises: Standing   Heel Raises  Both;20 reps    Heel Raises Limitations  heel and toe    Terminal Knee Extension  Right;15 reps    Theraband Level (Terminal Knee Extension)  Level 2 (Red)    Terminal Knee Extension Limitations  5" holds    Functional Squat  5 reps    Functional Squat Limitations  minisquats front of chair    Rocker Board  2 minutes    Rocker Board Limitations  lateral and Df/Pf    Gait Training  259ft with SPC      Knee/Hip Exercises: Seated   Sit to [employer]  10 reps;without UE support      Knee/Hip Exercises: Supine   Quad Sets  Right;15 reps    [employer]  Limitations  3" holds    Short Arc [employer]  20 reps    Short Arc Quad Sets Limitations  3" holds    Heel Slides  Right;10 reps    Heel Slides Limitations  3" holds    Terminal Knee Extension  AAROM;Right;1 set;10 reps    Terminal Knee Extension Limitations  5" holds    Knee Extension  AROM    Knee Extension Limitations  5    Knee Flexion  AROM    Knee Flexion Limitations  117   was 115 last sessoin     Manual Therapy   Manual Therapy  Edema management;Taping    Manual therapy comments  separate rest of treatment    Edema Management  retro massage BLE elevated +ankle pumps for reduced edema    Kinesiotex  Edema   kinesio tape for edema control            PT Education - 07/19/18 1406    Education Details  Pt educated on purpose of kinesiotape, educated on s/s for good skin integrity    Person(s) Educated  Patient    Methods  Explanation    Comprehension  Verbalized understanding       PT Short Term Goals - 07/05/18 1620      PT  SHORT TERM GOAL #1   Title  Pt will have decreased R knee edema at joint line by 2cm or > to decrease pain and improve ROM.    Time  3    Period  Weeks    Status  New    Target Date  07/26/18      PT SHORT TERM GOAL #2   Title  Pt will have improved R knee AROM from 5-105deg in order to maximize gait and reduce pain.    Time  3    Period  Weeks    Status  New      PT SHORT TERM GOAL #4   Title  Pt will be able to perform bil SLS for 5 sec without UE support to demo improved functional strength and to maximize gait.    Time  6    Period  Weeks    Status  New      PT SHORT TERM GOAL #5   Title  Pt will be able to perform 5xSTS in 15sec or < without UE support and without R knee buckling to demo improved balance and functional strength.    Time  3    Period  Weeks    Status  New        PT Long Term Goals - 07/05/18 1620      PT LONG TERM GOAL #1   Title  Pt will have improved R knee AROM from 0-115deg in order to further maximize gait and stair ambulation.     Time  6    Period  Weeks    Status  New    Target Date  08/16/18      PT LONG TERM GOAL #2   Title  Pt will have improved MMT to at least 4/5 or better throughout in order to maximize gait and balance.     Time  6    Period  Weeks    Status  New      PT LONG TERM GOAL #3   Title  Pt will be able to perform bil SLS for 10sec or > without UE support to further demo improved functional strength to  maximize gait on uneven ground and stair ambulation.     Time  6    Period  Weeks    Status  New      PT LONG TERM GOAL #4   Title  Pt will have 12700ft improvement in 3MWT with LRAD and gait WFL in order to demo improved functional strength and mobility in order to maximize community ambulation, access, and promote return to PLOF.     Time  6    Period  Weeks    Status  New            Plan - 07/19/18 1356    Clinical Impression Statement  Continued session focus with knee mobility, functional strengthening and  edema control contol.  Pt arrived wiht SPC and good mechanics noted, minimal cueing to emphasize heel strike to assist with knee extension during gait.  Added minisquats with mulimodal cueing to improve gluteal strengthening to improve hip extension with gait.  Pt improved distal quad contractions this session with less cueing and good patella mobility.  Pt continues to have edema present proximal knee.  EOS with retromassage for edema control and trial with kinesiotaping to assist wiht edema control as well.  Pt educated on purpose of tape as well as s/s to assure good skin integrity with verbalized understanding.  ROM continues to progress at 5-117 degrees (was 5-115 degrees last session.)  No reports of pain through session.    Rehab Potential  Good    PT Frequency  3x / week    PT Duration  6 weeks    PT Treatment/Interventions  ADLs/Self Care Home Management;Aquatic Therapy;Cryotherapy;Electrical Stimulation;Moist Heat;Ultrasound;DME Instruction;Gait training;Stair training;Functional mobility training;Therapeutic activities;Therapeutic exercise;Balance training;Neuromuscular re-education;Patient/family education;Manual techniques;Scar mobilization;Passive range of motion;Dry needling;Energy conservation;Taping;Spinal Manipulations;Joint Manipulations    PT Next Visit Plan  continue focus on edema and ROM prior to heavy strengthening; continue STM for restrictions and gait with SPC    PT Home Exercise Plan  eval: quad sets, heel slides       Patient will benefit from skilled therapeutic intervention in order to improve the following deficits and impairments:  Decreased activity tolerance, Abnormal gait, Decreased balance, Decreased endurance, Decreased mobility, Decreased range of motion, Decreased scar mobility, Decreased strength, Difficulty walking, Hypomobility, Increased edema, Increased fascial restricitons, Increased muscle spasms, Impaired flexibility, Improper body mechanics, Pain  Visit  Diagnosis: Stiffness of right knee, not elsewhere classified  Muscle weakness (generalized)  Localized edema  Difficulty in walking, not elsewhere classified     Problem List There are no active problems to display for this patient.  [address] , LPTA; CBIS [phone]  Juel Burrow,  Jo 07/19/2018, 2:06 PM  [facility] [address] [place], [place], [id number] Phone: [phone]   Fax:  [phone]  Name: Angela Larsen MRN: [id number] Date of Birth: 06/24/1951

## 2018-07-20 ENCOUNTER — Ambulatory Visit (HOSPITAL_COMMUNITY): Payer: Medicare Other

## 2018-07-20 ENCOUNTER — Encounter (HOSPITAL_COMMUNITY): Payer: Self-pay

## 2018-07-20 DIAGNOSIS — R262 Difficulty in walking, not elsewhere classified: Secondary | ICD-10-CM

## 2018-07-20 DIAGNOSIS — M6281 Muscle weakness (generalized): Secondary | ICD-10-CM

## 2018-07-20 DIAGNOSIS — M25661 Stiffness of right knee, not elsewhere classified: Secondary | ICD-10-CM

## 2018-07-20 DIAGNOSIS — R600 Localized edema: Secondary | ICD-10-CM

## 2018-07-20 NOTE — Therapy (Signed)
[facility] [address] [place], [place], [address] Phone: [phone]   Fax:  [phone]  Physical Therapy Treatment  Patient Details  Name: Angela Larsen MRN: [id number] Date of Birth: Feb 23, 1951 Referring Provider (PT): Sherlyn Lick, MD   Encounter Date: 07/20/2018  PT End of Session - 07/20/18 1349    Visit Number  7    Number of Visits  19    Date for PT Re-Evaluation  08/16/18   Minireassess 07/26/18   Authorization Type  [employer] Medicare    Authorization Time Period  07/05/18 to 08/16/18    Authorization - Visit Number  7    Authorization - Number of Visits  10    PT Start Time  1348   3' on bike, not included wiht charges   PT Stop Time  1435    PT Time Calculation (min)  47 min    Activity Tolerance  Patient tolerated treatment well;No increased pain    Behavior During Therapy  [facility] for tasks assessed/performed       History reviewed. No pertinent past medical history.  History reviewed. No pertinent surgical history.  There were no vitals filed for this visit.  Subjective Assessment - 07/20/18 1348    Subjective  Pt reports positivity effects following the kinesotape last session, no reports of pain today.  Arrived with RW due to reports of LBP earlier.    Currently in Pain?  No/denies                       [facility] Adult PT Treatment/Exercise - 07/20/18 0001      Ambulation/Gait   Ambulation Distance (Feet)  226 Feet    Assistive device  Straight cane    Gait Pattern  Step-through pattern;Decreased stance time - right;Decreased step length - left;Decreased hip/knee flexion - right;Decreased dorsiflexion - right;Antalgic;Trendelenburg      Exercises   Exercises  Knee/Hip      Knee/Hip Exercises: Stretches   Active Hamstring Stretch  3 reps;30 seconds;Right    Active Hamstring Stretch Limitations  supine wiht rope    Knee: Self-Stretch to increase Flexion  Right    Knee: Self-Stretch Limitations   10x10" holds, standing 12" step    Gastroc Stretch  Both;3 reps;30 seconds    Gastroc Stretch Limitations  slant board      Knee/Hip Exercises: Aerobic   Stationary Bike  x3 mins, seat 8, fwd revolution for mobility      Knee/Hip Exercises: Standing   Heel Raises  Both;20 reps    Heel Raises Limitations  heel and toe    Terminal Knee Extension  2 sets;10 reps;Theraband    Theraband Level (Terminal Knee Extension)  Level 2 (Red)    Terminal Knee Extension Limitations  5" holds    Lateral Step Up  Right;10 reps;Hand Hold: 1    Forward Step Up  Right;10 reps;Hand Hold: 1;Step Height: 4"    Functional Squat  10 reps    Functional Squat Limitations  minisquats front of chair    SLS  5x BLE Rt 2", Lt 5" max    Gait Training  235ft with SPC      Knee/Hip Exercises: Seated   Long Arc Quad  Right;15 reps    Long Arc Quad Limitations  3-5" holds    Sit to [employer]  10 reps;without UE support      Knee/Hip Exercises: Supine   [employer] reps  Quad Sets Limitations  3" holds    Short Arc [employer]  20 reps    Short Arc Quad Sets Limitations  3:" holds    Heel Slides  Right;10 reps    Heel Slides Limitations  3" holds    Terminal Knee Extension  AAROM;Right;1 set;10 reps    Terminal Knee Extension Limitations  5" holds    Knee Extension  AROM    Knee Extension Limitations  4   was 5   Knee Flexion  AROM    Knee Flexion Limitations  118   was 117     Manual Therapy   Manual Therapy  Edema management;Taping    Manual therapy comments  separate rest of treatment    Edema Management  retro massage BLE elevated +ankle pumps for reduced edema    Kinesiotex  Edema   kinesiotape for edema control              PT Short Term Goals - 07/05/18 1620      PT SHORT TERM GOAL #1   Title  Pt will have decreased R knee edema at joint line by 2cm or > to decrease pain and improve ROM.    Time  3    Period  Weeks    Status  New    Target Date  07/26/18      PT SHORT TERM GOAL  #2   Title  Pt will have improved R knee AROM from 5-105deg in order to maximize gait and reduce pain.    Time  3    Period  Weeks    Status  New      PT SHORT TERM GOAL #4   Title  Pt will be able to perform bil SLS for 5 sec without UE support to demo improved functional strength and to maximize gait.    Time  6    Period  Weeks    Status  New      PT SHORT TERM GOAL #5   Title  Pt will be able to perform 5xSTS in 15sec or < without UE support and without R knee buckling to demo improved balance and functional strength.    Time  3    Period  Weeks    Status  New        PT Long Term Goals - 07/05/18 1620      PT LONG TERM GOAL #1   Title  Pt will have improved R knee AROM from 0-115deg in order to further maximize gait and stair ambulation.     Time  6    Period  Weeks    Status  New    Target Date  08/16/18      PT LONG TERM GOAL #2   Title  Pt will have improved MMT to at least 4/5 or better throughout in order to maximize gait and balance.     Time  6    Period  Weeks    Status  New      PT LONG TERM GOAL #3   Title  Pt will be able to perform bil SLS for 10sec or > without UE support to further demo improved functional strength to maximize gait on uneven ground and stair ambulation.     Time  6    Period  Weeks    Status  New      PT LONG TERM GOAL #4   Title  Pt will have 136ft improvement  in 3MWT with LRAD and gait WFL in order to demo improved functional strength and mobility in order to maximize community ambulation, access, and promote return to PLOF.     Time  6    Period  Weeks    Status  New            Plan - 07/20/18 1623    Clinical Impression Statement  Pt reports positive results with edema control and comfort sleeping following additional kinesiotaping last sessoin, continued with taping at EOS today.  Continued session focsu wiht knee mobility, funcitonal strengthening and edema control.  Pt ambulated with SPC through session today, good  sequence and equal stride length, did require cueing to emphasize heel strike to assist with knee extension during gait.  Added forward and lateral step ups for quad strengthening as well as SLS for hip stability.  Pt with difficulty with SLS, encouraged to add to HEP.  EOS with retormassage for edema control and kinesiotaping assistance.  AROM 4-118 degrees (was 5-117 degrees last session).    Rehab Potential  Good    PT Frequency  3x / week    PT Duration  6 weeks    PT Treatment/Interventions  ADLs/Self Care Home Management;Aquatic Therapy;Cryotherapy;Electrical Stimulation;Moist Heat;Ultrasound;DME Instruction;Gait training;Stair training;Functional mobility training;Therapeutic activities;Therapeutic exercise;Balance training;Neuromuscular re-education;Patient/family education;Manual techniques;Scar mobilization;Passive range of motion;Dry needling;Energy conservation;Taping;Spinal Manipulations;Joint Manipulations    PT Next Visit Plan  continue focus on edema and ROM prior to heavy strengthening; continue STM [facility] restrictions and [facility]    PT Home Exercise Plan  eval: quad sets, heel slides; 07/20/18: SLS and TKR wiht GTB       Patient will benefit from skilled therapeutic intervention in order to improve the following deficits and impairments:  Decreased activity tolerance, Abnormal gait, Decreased balance, Decreased endurance, Decreased mobility, Decreased range of motion, Decreased scar mobility, Decreased strength, Difficulty walking, Hypomobility, Increased edema, Increased fascial restricitons, Increased muscle spasms, Impaired flexibility, Improper body mechanics, Pain  Visit Diagnosis: Stiffness of right knee, not elsewhere classified  Muscle weakness (generalized)  Localized edema  Difficulty in walking, not elsewhere classified     Problem List There are no active problems to display for this patient.  [address]Yolanda Dockendorf, LPTA; CBIS [phone]  Juel BurrowCockerham, Royal Vandevoort  Jo 07/20/2018, 4:34 PM  [facility] [address] [place], [place], [address] Phone: [phone]   Fax:  [phone]  Name: Angela Larsen MRN: [id number] Date of Birth: 04/13/1951

## 2018-07-20 NOTE — Patient Instructions (Signed)
Knee Extension: Terminal - Standing (Single Leg)    Face anchor in shoulder width stance, band around knee. Allow tension of band to slightly bend knee. Pull leg back, straightening knee. Repeat 15 times per set.  Do 1-2 sets per session. Do 4 sessions per week. Anchor Height: Knee  [email or web address].[email or web address]   Copyright  VHI. All rights reserved.

## 2018-07-21 ENCOUNTER — Ambulatory Visit (HOSPITAL_COMMUNITY): Payer: Medicare Other

## 2018-07-21 ENCOUNTER — Encounter (HOSPITAL_COMMUNITY): Payer: Self-pay

## 2018-07-21 DIAGNOSIS — M25661 Stiffness of right knee, not elsewhere classified: Secondary | ICD-10-CM

## 2018-07-21 DIAGNOSIS — R600 Localized edema: Secondary | ICD-10-CM

## 2018-07-21 DIAGNOSIS — R262 Difficulty in walking, not elsewhere classified: Secondary | ICD-10-CM

## 2018-07-21 DIAGNOSIS — M6281 Muscle weakness (generalized): Secondary | ICD-10-CM

## 2018-07-21 NOTE — Therapy (Signed)
[facility] [address] [place], [place], [address] Phone: [phone]   Fax:  [phone]  Physical Therapy Treatment  Patient Details  Name: Angela Larsen MRN: [id number] Date of Birth: [birth year] Referring Provider (PT): Sherlyn Lick, MD   Encounter Date: 07/21/2018  PT End of Session - 07/21/18 1114    Visit Number  8    Number of Visits  19    Date for PT Re-Evaluation  08/16/18   Minireassess 07/26/18   Authorization Type  [employer]HC Medicare    Authorization Time Period  07/05/18 to 08/16/18    Authorization - Visit Number  8    Authorization - Number of Visits  10    PT Start Time  1115    PT Stop Time  1204    PT Time Calculation (min)  49 min    Activity Tolerance  Patient tolerated treatment well;No increased pain    Behavior During Therapy  [facility] for tasks assessed/performed       History reviewed. No pertinent past medical history.  History reviewed. No pertinent surgical history.  There were no vitals filed for this visit.  Subjective Assessment - 07/21/18 1208    Subjective  Pt states that she is doing well. She is really liking the tape and feels it is really helping her edema    Currently in Pain?  No/denies          [facility] Adult PT Treatment/Exercise - 07/21/18 0001      Exercises   Exercises  Knee/Hip      Knee/Hip Exercises: Stretches   Active Hamstring Stretch  3 reps;30 seconds;Right    Active Hamstring Stretch Limitations  supine wiht rope    Knee: Self-Stretch to increase Flexion  Right    Knee: Self-Stretch Limitations  10x10" holds, standing 12" step    Gastroc Stretch  Both;3 reps;30 seconds    Gastroc Stretch Limitations  slant board      Knee/Hip Exercises: Aerobic   Stationary Bike  x3 mins, seat 8, fwd revolution for mobility      Knee/Hip Exercises: Standing   Heel Raises  Both;15 reps    Heel Raises Limitations  heel and toe slope    Terminal Knee Extension  Right;10 reps    Theraband  Level (Terminal Knee Extension)  Level 2 (Red)    Terminal Knee Extension Limitations  5-10" holds    Lateral Step Up  Right;10 reps;Step Height: 6";Hand Hold: 2    Forward Step Up  Right;10 reps;Step Height: 6";Hand Hold: 2    Step Down  Right;10 reps;Step Height: 6"    Step Down Limitations  tactile cues to reduce foot ER    Functional Squat  10 reps    Functional Squat Limitations  minisquats front of chair    Wall Squat  2 sets;10 reps    SLS  BLE x5RT with 1 fingertip assist    Rebounder  bil tandem stance 3x10" holds each    Gait Training  x1.5 laps around gym with SPC; cues to reduce R foot ER      Knee/Hip Exercises: Supine   Knee Extension Limitations  3    Knee Flexion Limitations  117      Manual Therapy   Manual Therapy  Edema management;Taping    Manual therapy comments  separate rest of treatment    Edema Management  retro massage BLE elevated +ankle pumps for reduced edema    Kinesiotex  Edema  kinesiotape for edema control            PT Education - 07/21/18 1209    Education Details  exercise technique, updated HEP    Person(s) Educated  Patient    Methods  Explanation;Demonstration;Handout    Comprehension  Verbalized understanding;Returned demonstration       PT Short Term Goals - 07/05/18 1620      PT SHORT TERM GOAL #1   Title  Pt will have decreased R knee edema at joint line by 2cm or > to decrease pain and improve ROM.    Time  3    Period  Weeks    Status  New    Target Date  07/26/18      PT SHORT TERM GOAL #2   Title  Pt will have improved R knee AROM from 5-105deg in order to maximize gait and reduce pain.    Time  3    Period  Weeks    Status  New      PT SHORT TERM GOAL #4   Title  Pt will be able to perform bil SLS for 5 sec without UE support to demo improved functional strength and to maximize gait.    Time  6    Period  Weeks    Status  New      PT SHORT TERM GOAL #5   Title  Pt will be able to perform 5xSTS in 15sec or <  without UE support and without R knee buckling to demo improved balance and functional strength.    Time  3    Period  Weeks    Status  New        PT Long Term Goals - 07/05/18 1620      PT LONG TERM GOAL #1   Title  Pt will have improved R knee AROM from 0-115deg in order to further maximize gait and stair ambulation.     Time  6    Period  Weeks    Status  New    Target Date  08/16/18      PT LONG TERM GOAL #2   Title  Pt will have improved MMT to at least 4/5 or better throughout in order to maximize gait and balance.     Time  6    Period  Weeks    Status  New      PT LONG TERM GOAL #3   Title  Pt will be able to perform bil SLS for 10sec or > without UE support to further demo improved functional strength to maximize gait on uneven ground and stair ambulation.     Time  6    Period  Weeks    Status  New      PT LONG TERM GOAL #4   Title  Pt will have 13ft improvement in with LRAD and gait WFL in order to demo improved functional strength and mobility in order to maximize community ambulation, access, and promote return to PLOF.     Time  6    Period  Weeks    Status  New            Plan - 07/21/18 1206    Clinical Impression Statement  Continued with established POC focusing on gaining last bit of ROM, functional strength, and balance. Gait continues to demo R foot ER which is corrected with cues. Added step downs and wall slides this date with good tolerance. Also  added tandem stance for balance this date. Ended with manual for edema and reapplied k-tape to assist with reducing edema. AROM 3 to 117deg this date. Continue as planned, progressing as able.     Rehab Potential  Good    PT Frequency  3x / week    PT Duration  6 weeks    PT Treatment/Interventions  ADLs/Self Care Home Management;Aquatic Therapy;Cryotherapy;Electrical Stimulation;Moist Heat;Ultrasound;DME Instruction;Gait training;Stair training;Functional mobility training;Therapeutic  activities;Therapeutic exercise;Balance training;Neuromuscular re-education;Patient/family education;Manual techniques;Scar mobilization;Passive range of motion;Dry needling;Energy conservation;Taping;Spinal Manipulations;Joint Manipulations    PT Next Visit Plan  continue focus on ROM and progress strenghtneing as able, balance work, STM for restrictions, and gait with SPC, and taping PRN for edema    PT Home Exercise Plan  eval: quad sets, heel slides; 07/20/18: SLS and TKE wiht GTB; 11/15: standing knee flex, TKE, LAQ    Consulted and Agree with Plan of Care  Patient       Patient will benefit from skilled therapeutic intervention in order to improve the following deficits and impairments:  Decreased activity tolerance, Abnormal gait, Decreased balance, Decreased endurance, Decreased mobility, Decreased range of motion, Decreased scar mobility, Decreased strength, Difficulty walking, Hypomobility, Increased edema, Increased fascial restricitons, Increased muscle spasms, Impaired flexibility, Improper body mechanics, Pain  Visit Diagnosis: Stiffness of right knee, not elsewhere classified  Muscle weakness (generalized)  Localized edema  Difficulty in walking, not elsewhere classified     Problem List There are no active problems to display for this patient.       Jac CanavanBrooke Powell PT, DPT  [place] [facility] [address] [place], [place], [address] Phone: [phone]   Fax:  [phone]  Name: Angela Larsen MRN: [id number] Date of Birth: 04/20/1951

## 2018-07-25 ENCOUNTER — Ambulatory Visit (HOSPITAL_COMMUNITY): Payer: Medicare Other

## 2018-07-25 ENCOUNTER — Encounter (HOSPITAL_COMMUNITY): Payer: Self-pay

## 2018-07-25 DIAGNOSIS — R600 Localized edema: Secondary | ICD-10-CM

## 2018-07-25 DIAGNOSIS — R262 Difficulty in walking, not elsewhere classified: Secondary | ICD-10-CM

## 2018-07-25 DIAGNOSIS — M25661 Stiffness of right knee, not elsewhere classified: Secondary | ICD-10-CM

## 2018-07-25 DIAGNOSIS — M6281 Muscle weakness (generalized): Secondary | ICD-10-CM

## 2018-07-25 NOTE — Therapy (Signed)
[facility] [address] [place], [place], [address] Phone: [phone]   Fax:  [phone]  Physical Therapy Treatment  Patient Details  Name: Angela Larsen MRN: [id number] Date of Birth: 22-Sep-1950 Referring Provider (PT): Sherlyn Lick, MD   Encounter Date: 07/25/2018  PT End of Session - 07/25/18 1309    Visit Number  9    Number of Visits  19    Date for PT Re-Evaluation  08/16/18   Minireassess 07/26/18   Authorization Type  [employer] Medicare    Authorization Time Period  07/05/18 to 08/16/18    Authorization - Visit Number  9    Authorization - Number of Visits  10    PT Start Time  1301   3' on bike, not included with charges   PT Stop Time  1351    PT Time Calculation (min)  50 min    Activity Tolerance  Patient tolerated treatment well;No increased pain    Behavior During Therapy  [facility] for tasks assessed/performed       History reviewed. No pertinent past medical history.  History reviewed. No pertinent surgical history.  There were no vitals filed for this visit.  Subjective Assessment - 07/25/18 1304    Subjective  Pt stated she is feeling good today.  Reports the tape seems to help wiht her swelling.  Returned to church Sunday for the first day, proud of herself, reports increased swelling following sitting and standing for long periods    Currently in Pain?  No/denies                       [facility] Adult PT Treatment/Exercise - 07/25/18 0001      Exercises   Exercises  Knee/Hip      Knee/Hip Exercises: Stretches   Knee: Self-Stretch to increase Flexion  Right    Knee: Self-Stretch Limitations  10x10" holds, standing 12" step      Knee/Hip Exercises: Aerobic   Stationary Bike  x3 mins, seat 8, fwd revolution for mobility      Knee/Hip Exercises: Standing   Heel Raises  Both;20 reps    Heel Raises Limitations  heel and toe slope    Lateral Step Up  Right;15 reps;Hand Hold: 2;Step Height: 6"     Forward Step Up  Right;15 reps;Hand Hold: 2;Step Height: 6"    Step Down  Right;10 reps;Step Height: 6"    Step Down Limitations  tactile cues to reduce foot ER    Functional Squat  10 reps    Functional Squat Limitations  minisquats front of chair    Wall Squat  2 sets;10 reps    SLS  BLE x5RT with 1 fingertip assist    SLS with Vectors  3x 5" BLE wiht 1 HHA    Other Standing Knee Exercises  tandem stance 2x 30" on foam      Knee/Hip Exercises: Seated   Sit to Sand  10 reps;without UE support      Knee/Hip Exercises: Supine   Knee Extension Limitations  3    Knee Flexion Limitations  117      Manual Therapy   Manual Therapy  Edema management;Taping    Manual therapy comments  separate rest of treatment    Edema Management  retro massage BLE elevated +ankle pumps for reduced edema    Kinesiotex  Edema             PT Education -  07/25/18 1636    Education Details  Educated on supportive shoes to address increased pronation of foot, discussed kinesiotape purchase.  WIll need further education on placement of tape and appropriate resistance with tape    Person(s) Educated  Patient    Methods  Explanation    Comprehension  Verbalized understanding       PT Short Term Goals - 07/05/18 1620      PT SHORT TERM GOAL #1   Title  Pt will have decreased R knee edema at joint line by 2cm or > to decrease pain and improve ROM.    Time  3    Period  Weeks    Status  New    Target Date  07/26/18      PT SHORT TERM GOAL #2   Title  Pt will have improved R knee AROM from 5-105deg in order to maximize gait and reduce pain.    Time  3    Period  Weeks    Status  New      PT SHORT TERM GOAL #4   Title  Pt will be able to perform bil SLS for 5 sec without UE support to demo improved functional strength and to maximize gait.    Time  6    Period  Weeks    Status  New      PT SHORT TERM GOAL #5   Title  Pt will be able to perform 5xSTS in 15sec or < without UE support and  without R knee buckling to demo improved balance and functional strength.    Time  3    Period  Weeks    Status  New        PT Long Term Goals - 07/05/18 1620      PT LONG TERM GOAL #1   Title  Pt will have improved R knee AROM from 0-115deg in order to further maximize gait and stair ambulation.     Time  6    Period  Weeks    Status  New    Target Date  08/16/18      PT LONG TERM GOAL #2   Title  Pt will have improved MMT to at least 4/5 or better throughout in order to maximize gait and balance.     Time  6    Period  Weeks    Status  New      PT LONG TERM GOAL #3   Title  Pt will be able to perform bil SLS for 10sec or > without UE support to further demo improved functional strength to maximize gait on uneven ground and stair ambulation.     Time  6    Period  Weeks    Status  New      PT LONG TERM GOAL #4   Title  Pt will have 15600ft improvement in 3MWT with LRAD and gait WFL in order to demo improved functional strength and mobility in order to maximize community ambulation, access, and promote return to PLOF.     Time  6    Period  Weeks    Status  New            Plan - 07/25/18 1628    Clinical Impression Statement  Continued wiht established POC for knee mobility, gait training and functional strengthening.  Pt continues to ambulate with excessive ER which improves with cueing.  Noted collapsed arch and increased pronation compared to opposite LE,  pt educated on benefits of ambulating wiht supportive shoes and discussed stores to possible purchase.  Pt also reports vast improvements with edema control wiht kinesiotape technqiue, pt educated on where to purchase the tape and examined procedure for placement.  Pt will need further education on the resistance for taping independenlty.  Continued wiht step up training for functional strengthening.  Pt continues to have difficulty with SLS, added vector stance and continued wiht tandem stance.  EOS wiht manual and  k-taping for edema control.  AROM 3-117 degrees.  No reports of pain through session.      Rehab Potential  Good    PT Frequency  3x / week    PT Duration  6 weeks    PT Treatment/Interventions  ADLs/Self Care Home Management;Aquatic Therapy;Cryotherapy;Electrical Stimulation;Moist Heat;Ultrasound;DME Instruction;Gait training;Stair training;Functional mobility training;Therapeutic activities;Therapeutic exercise;Balance training;Neuromuscular re-education;Patient/family education;Manual techniques;Scar mobilization;Passive range of motion;Dry needling;Energy conservation;Taping;Spinal Manipulations;Joint Manipulations    PT Next Visit Plan  continue focus on ROM and progress strenghtneing as able, balance work, STM for restrictions, and gait with SPC, and taping PRN for edema    PT Home Exercise Plan  eval: quad sets, heel slides; 07/20/18: SLS and TKE wiht GTB; 11/15: standing knee flex, TKE, LAQ       Patient will benefit from skilled therapeutic intervention in order to improve the following deficits and impairments:  Decreased activity tolerance, Abnormal gait, Decreased balance, Decreased endurance, Decreased mobility, Decreased range of motion, Decreased scar mobility, Decreased strength, Difficulty walking, Hypomobility, Increased edema, Increased fascial restricitons, Increased muscle spasms, Impaired flexibility, Improper body mechanics, Pain  Visit Diagnosis: Stiffness of right knee, not elsewhere classified  Muscle weakness (generalized)  Localized edema  Difficulty in walking, not elsewhere classified     Problem List There are no active problems to display for this patient.  [address] LPTA; CBIS [phone]  Juel Burrow 07/25/2018, 4:37 PM  [facility] [address] [place], [place], [address] Phone: [phone]   Fax:  [phone]  Name: DAZANI NORBY MRN: [id number] Date of Birth: Sep 15, 1950

## 2018-07-26 ENCOUNTER — Ambulatory Visit (HOSPITAL_COMMUNITY): Payer: Medicare Other

## 2018-07-26 ENCOUNTER — Encounter (HOSPITAL_COMMUNITY): Payer: Self-pay

## 2018-07-26 DIAGNOSIS — R600 Localized edema: Secondary | ICD-10-CM

## 2018-07-26 DIAGNOSIS — M25661 Stiffness of right knee, not elsewhere classified: Secondary | ICD-10-CM

## 2018-07-26 DIAGNOSIS — R262 Difficulty in walking, not elsewhere classified: Secondary | ICD-10-CM

## 2018-07-26 DIAGNOSIS — M6281 Muscle weakness (generalized): Secondary | ICD-10-CM

## 2018-07-26 NOTE — Patient Instructions (Signed)
FUNCTIONAL MOBILITY: Squat    Stance: shoulder-width on floor. Bend hips and knees. Keep back straight. Do not allow knees to bend past toes. Squeeze glutes and quads to stand. 10 reps per set, 1-2 sets per day, 4 days per week  Copyright  VHI. All rights reserved.   Single Leg Balance: Eyes Open    Stand on right leg with eyes open. Hold 30 seconds. 5 reps 1-2 times per day.  http://ggbe.exer.us/5   Copyright  VHI. All rights reserved.   Straight Leg Raise    Tighten stomach and slowly raise locked right leg 10 inches from floor. Repeat 10 times per set. Do 1-2 sets per day.  http://orth.exer.us/1103   Copyright  VHI. All rights reserved.   Quad Set    With other leg bent, foot flat, slowly tighten muscles on thigh of straight leg while counting out loud to 5". Repeat with other leg. Repeat 10 times. Do 5 sessions per day.  http://gt2.exer.us/276   Copyright  VHI. All rights reserved.

## 2018-07-26 NOTE — Therapy (Addendum)
[place], [place], [address] Phone: [phone]   Fax:  [phone]   Progress Note Reporting Period 07/05/18 to 07/26/18  See note below for Objective Data and Assessment of Progress/Goals.    I spoke with this PTA prior to the reassessment and agree with its findings.  Geraldine Solar PT, DPT  Physical Therapy Treatment  Patient Details  Name: Angela Larsen MRN: [id number] Date of Birth: Aug 28, 1951 Referring Provider (PT): Carlus Pavlov, MD  Progress Note Reporting Period 10/30 to 07/26/18  See note below for Objective Data and Assessment of Progress/Goals.  # OF FEET WALKED: 522 with SPC ROM:  Flexion: 118 degrees            Extension: 3 degrees   Encounter Date: 07/26/2018  PT End of Session - 07/26/18 1600    Visit Number  10    Number of Visits  19    Date for PT Re-Evaluation  08/16/18   Minireassess complete on 07/26/18   Authorization Type  [employer] Medicare    Authorization Time Period  07/05/18 to 08/16/18    Authorization - Visit Number  10    Authorization - Number of Visits  10    PT Start Time  0177   3' on bike, FOTO complete on bike, not included charges   PT Stop Time  1600    PT Time Calculation (min)  43 min    Activity Tolerance  Patient tolerated treatment well;No increased pain    Behavior During Therapy  [facility] for tasks assessed/performed       History reviewed. No pertinent past medical history.  History reviewed. No pertinent surgical history.  There were no vitals filed for this visit.      [facility] PT Assessment - 07/26/18 0001      Assessment   Medical Diagnosis  R TKA    Referring Provider (PT)  Carlus Pavlov, MD    Onset Date/Surgical Date  06/06/18    Next MD Visit  09/08/2018    Prior Therapy  HHPT ended on 06/30/18      Observation/Other Assessments   Focus on Therapeutic Outcomes (FOTO)   31% limitation   was 42% limitation     Circumferential Edema   Circumferential - Right  43   was 45.5 joint line   Circumferential - Left   41.5cm, joint line      ROM / Strength   AROM / PROM / Strength  AROM;Strength      AROM   AROM Assessment Site  Knee    Right/Left Knee  Right    Right Knee Extension  3   was 9   Right Knee Flexion  118   was 96     Strength   Strength Assessment Site  Hip;Knee;Ankle    Right Hip Flexion  3/5   was 3-/5   Right Hip Extension  3-/5   11/20 prone 3-/5 was 4-/5 based on functional abilities   Right Hip ABduction  3/5   was 2+/5   Left Hip Flexion  4-/5   was 4-/5   Left Hip Extension  3-/5   was 4-/5   Left Hip ABduction  4-/5   was 3+/5   Right Knee Flexion  4/5   was 4/5   Right Knee Extension  3+/5   was 3-/5   Left Knee Flexion  5/5    Left Knee Extension  4/5   was 4/5  Right Ankle Dorsiflexion  4/5   was 4/5   Left Ankle Dorsiflexion  5/5   was 4+/5     Ambulation/Gait   Ambulation Distance (Feet)  522 Feet  With SPC 3MWT was 426 wiht RW   Gait velocity  .93472 m/s      Static Standing Balance   Static Standing - Balance Support  No upper extremity supported    Static Standing Balance -  Activities   Single Leg Stance - Right Leg;Single Leg Stance - Left Leg    Static Standing - Comment/# of Minutes  Lt 11" max, Rt 1-2" max      Standardized Balance Assessment   Standardized Balance Assessment  Five Times Sit to Stand    Five times sit to stand comments   13.56                   [facility] Adult PT Treatment/Exercise - 07/26/18 0001      Ambulation/Gait   Assistive device  Straight cane    Gait Pattern  Step-through pattern;Decreased stance time - right;Decreased step length - left;Decreased hip/knee flexion - right;Decreased dorsiflexion - right;Antalgic;Trendelenburg    Gait Comments  Cueing for heel strike and to reduce ER Rt LE      Exercises   Exercises  Knee/Hip      Knee/Hip Exercises: Aerobic   Stationary Bike  x3 mins, seat 8, fwd revolution for  mobility      Knee/Hip Exercises: Standing   Functional Squat  10 reps    Functional Squat Limitations  minisquats front of chair    Stairs  3RT ascend 7in descend 4in; initially presented wiht step to pattern, able to complete reciprocal ascending following cueing    SLS  BLE x5RT with 1 fingertip assist; Rt 2" max fatigued; Lt 11" max      Knee/Hip Exercises: Seated   Sit to Sand  5 reps;without UE support   5 STS 13.56" no HHA     Knee/Hip Exercises: Supine   Knee Extension Limitations  3    Knee Flexion Limitations  118               PT Short Term Goals - 07/26/18 1655      PT SHORT TERM GOAL #1   Title  Pt will have decreased R knee edema at joint line by 2cm or > to decrease pain and improve ROM.    Baseline  07/26/18: Rt knee edema at joint line 43 cm; was 45.5cm    Status  Achieved      PT SHORT TERM GOAL #2   Title  Pt will have improved R knee AROM from 5-105deg in order to maximize gait and reduce pain.    Baseline  07/26/18:  AROM 3-118 degrees (was 5-105 degrees)    Status  Achieved        PT Long Term Goals - 07/26/18 1657      PT LONG TERM GOAL #1   Title  Pt will have improved R knee AROM from 0-115deg in order to further maximize gait and stair ambulation.     Baseline  07/26/18: 3-118 degrees (was 9-96 degrees initial eval)      PT LONG TERM GOAL #2   Title  Pt will have improved MMT to at least 4/5 or better throughout in order to maximize gait and balance.     Baseline  07/26/18: see MMT    Status  On-going  PT LONG TERM GOAL #3   Title  Pt will be able to perform bil SLS for 10sec or > without UE support to further demo improved functional strength to maximize gait on uneven ground and stair ambulation.     Baseline  07/26/18: ongoing    Status  On-going      PT LONG TERM GOAL #4   Title  Pt will have 157f improvement in 3MWT with LRAD and gait WFL in order to demo improved functional strength and mobility in order to maximize  community ambulation, access, and promote return to PLOF.     Baseline  07/26/18: 3MWT 522 feet with SPC was 426 ft with RW eval    Status  On-going            Plan - 07/26/18 1659    Clinical Impression Statement  Reviewed goals wiht the following findings:  Pt has met 2/2 STGs and ongoing wiht all LTGs.  Pt reports compliance wiht HEP daily.  Presents with improved knee mobility wiht AROM at 3-118 degrees (was 9-96 degrees eval).  Edema proximal knee has reduced by 2.5 cm since initial eval.  Improved gait velocity with 3MWT with LRAD (SPC).  Pt continues to demonstate weakness with hip and knee musculature and difficulty with funcitonal tasks including stairs due to weakness.  Also continues to demonstrate weakness wiht balance which increases her risk of fall.      Rehab Potential  Good    PT Frequency  3x / week    PT Duration  6 weeks    PT Treatment/Interventions  ADLs/Self Care Home Management;Aquatic Therapy;Cryotherapy;Electrical Stimulation;Moist Heat;Ultrasound;DME Instruction;Gait training;Stair training;Functional mobility training;Therapeutic activities;Therapeutic exercise;Balance training;Neuromuscular re-education;Patient/family education;Manual techniques;Scar mobilization;Passive range of motion;Dry needling;Energy conservation;Taping;Spinal Manipulations;Joint Manipulations    PT Next Visit Plan  Add sidestep with resistance if able to keep hips in neutral and clams.  Continue focus on end range ROM.  Increase focus with hip strengthening especially gluteal muscualure.  Continue gait training wiht SPC and taping PRN for edema.  Pt reports she is going shoe shopping and possibly purchasing tape on Monday 07/31/18.  Instruct how to place tape if purchased.    PT Home Exercise Plan  eval: quad sets, heel slides; 07/20/18: SLS and TKE wiht GTB; 11/15: standing knee flex, TKE, LAQ; 07/31/18: squat, SLR, quad sets and SLS       Patient will benefit from skilled therapeutic  intervention in order to improve the following deficits and impairments:  Decreased activity tolerance, Abnormal gait, Decreased balance, Decreased endurance, Decreased mobility, Decreased range of motion, Decreased scar mobility, Decreased strength, Difficulty walking, Hypomobility, Increased edema, Increased fascial restricitons, Increased muscle spasms, Impaired flexibility, Improper body mechanics, Pain  Visit Diagnosis: Stiffness of right knee, not elsewhere classified  Muscle weakness (generalized)  Localized edema  Difficulty in walking, not elsewhere classified     Problem List There are no active problems to display for this patient.  [address] LPTA; CRanger CAldona Lento11/20/2019, 5:15 PM  [address] [address]Phone: [phone]  Fax:  [phone] Name: Angela GEMMILLMRN: [id number] of Birth: 102-09-1950

## 2018-07-28 ENCOUNTER — Encounter (HOSPITAL_COMMUNITY): Payer: Self-pay | Admitting: Physical Therapy

## 2018-07-28 ENCOUNTER — Ambulatory Visit (HOSPITAL_COMMUNITY): Payer: Medicare Other | Admitting: Physical Therapy

## 2018-07-28 DIAGNOSIS — M6281 Muscle weakness (generalized): Secondary | ICD-10-CM

## 2018-07-28 DIAGNOSIS — R600 Localized edema: Secondary | ICD-10-CM

## 2018-07-28 DIAGNOSIS — M25661 Stiffness of right knee, not elsewhere classified: Secondary | ICD-10-CM

## 2018-07-28 DIAGNOSIS — R262 Difficulty in walking, not elsewhere classified: Secondary | ICD-10-CM

## 2018-07-28 NOTE — Therapy (Signed)
[facility] [address] [place], [place], [address] Phone: [phone]   Fax:  [phone]  Physical Therapy Treatment  Patient Details  Name: Angela Larsen MRN: [id number] Date of Birth: Oct 06, 1950 Referring Provider (PT): Sherlyn Lick, MD   Encounter Date: 07/28/2018  PT End of Session - 07/28/18 1506    Visit Number  11    Number of Visits  19    Date for PT Re-Evaluation  08/16/18   Minireassess complete on 07/26/18   Authorization Type  UHC Medicare    Authorization Time Period  07/05/18 to 08/16/18    Authorization - Visit Number  1    Authorization - Number of Visits  10    PT Start Time  1500   3' on the bike unbilled   PT Stop Time  1541    PT Time Calculation (min)  41 min    Activity Tolerance  Patient tolerated treatment well;No increased pain    Behavior During Therapy  [facility] for tasks assessed/performed       History reviewed. No pertinent past medical history.  History reviewed. No pertinent surgical history.  There were no vitals filed for this visit.  Subjective Assessment - 07/28/18 1505    Subjective  Patient reported she is feeling good today. She stated that she is sore, but that she isn't having any pain.     Currently in Pain?  No/denies                       [facility] Adult PT Treatment/Exercise - 07/28/18 0001      Knee/Hip Exercises: Stretches   Active Hamstring Stretch  3 reps;30 seconds;Right    Active Hamstring Stretch Limitations  Standing on 12'' step    Knee: Self-Stretch to increase Flexion  Right    Knee: Self-Stretch Limitations  10x10" holds, standing 12" step    Gastroc Stretch  Both;3 reps;30 seconds    Gastroc Stretch Limitations  slant board      Knee/Hip Exercises: Aerobic   Stationary Bike  x3 mins, seat 8, fwd revolution for mobility      Knee/Hip Exercises: Standing   Heel Raises  Both;20 reps    Heel Raises Limitations  heel and toe slope    Lateral Step Up   Right;15 reps;Hand Hold: 2;Step Height: 6"    Forward Step Up  Right;15 reps;Hand Hold: 2;Step Height: 6"    Step Down  Right;10 reps;Step Height: 6"    Functional Squat  10 reps    Functional Squat Limitations  minisquats front of chair    SLS  BLE x5    Other Standing Knee Exercises  Sidestepping inside parallel bars with RTB around thighs x 3 roundtrips. tandem stance 2x 30" on foam      Knee/Hip Exercises: Supine   Knee Extension Limitations  3    Knee Flexion Limitations  119      Manual Therapy   Manual Therapy  Edema management    Manual therapy comments  separate rest of treatment    Edema Management  retro massage BLE elevated for reduced edema             PT Education - 07/28/18 1506    Education Details  Discussed purpose and technique of interventions throughout session.     Person(s) Educated  Patient    Methods  Explanation    Comprehension  Verbalized understanding  PT Short Term Goals - 07/26/18 1655      PT SHORT TERM GOAL #1   Title  Pt will have decreased R knee edema at joint line by 2cm or > to decrease pain and improve ROM.    Baseline  07/26/18: Rt knee edema at joint line 43 cm; was 45.5cm    Status  Achieved      PT SHORT TERM GOAL #2   Title  Pt will have improved R knee AROM from 5-105deg in order to maximize gait and reduce pain.    Baseline  07/26/18:  AROM 3-118 degrees (was 5-105 degrees)    Status  Achieved        PT Long Term Goals - 07/26/18 1657      PT LONG TERM GOAL #1   Title  Pt will have improved R knee AROM from 0-115deg in order to further maximize gait and stair ambulation.     Baseline  07/26/18: 3-118 degrees (was 9-96 degrees initial eval)      PT LONG TERM GOAL #2   Title  Pt will have improved MMT to at least 4/5 or better throughout in order to maximize gait and balance.     Baseline  07/26/18: see MMT    Status  On-going      PT LONG TERM GOAL #3   Title  Pt will be able to perform bil SLS for 10sec or  > without UE support to further demo improved functional strength to maximize gait on uneven ground and stair ambulation.     Baseline  07/26/18: ongoing    Status  On-going      PT LONG TERM GOAL #4   Title  Pt will have 159ft improvement in with LRAD and gait WFL in order to demo improved functional strength and mobility in order to maximize community ambulation, access, and promote return to PLOF.     Baseline  07/26/18: 522 feet with SPC was 426 ft with RW eval    Status  On-going            Plan - 07/28/18 1549    Clinical Impression Statement  This session continued with established plan of care. Patient reported a lot of soreness in legs today, but denied pain. Exercises were modified to patient's tolerance due to this. This session added sidestepping with red theraband around hips inside parallel bars this session to improve hip strengthening. Patient reported decreased soreness following manual therapy this session. Patient would benefit from continued skilled physical therapy in order to continue progressing towards functional goals.     Rehab Potential  Good    PT Frequency  3x / week    PT Duration  6 weeks    PT Treatment/Interventions  ADLs/Self Care Home Management;Aquatic Therapy;Cryotherapy;Electrical Stimulation;Moist Heat;Ultrasound;DME Instruction;Gait training;Stair training;Functional mobility training;Therapeutic activities;Therapeutic exercise;Balance training;Neuromuscular re-education;Patient/family education;Manual techniques;Scar mobilization;Passive range of motion;Dry needling;Energy conservation;Taping;Spinal Manipulations;Joint Manipulations    PT Next Visit Plan  Continue focus on end range ROM.  Increase focus with hip strengthening especially gluteal muscualure.  Continue gait training wiht SPC and taping PRN for edema.  Pt reports she is going shoe shopping and possibly purchasing tape on Monday 07/31/18.  Instruct how to place tape if purchased.     PT Home Exercise Plan  eval: quad sets, heel slides; 07/20/18: SLS and TKE wiht GTB; 11/15: standing knee flex, TKE, LAQ; 07/31/18: squat, SLR, quad sets and SLS       Patient will  benefit from skilled therapeutic intervention in order to improve the following deficits and impairments:  Decreased activity tolerance, Abnormal gait, Decreased balance, Decreased endurance, Decreased mobility, Decreased range of motion, Decreased scar mobility, Decreased strength, Difficulty walking, Hypomobility, Increased edema, Increased fascial restricitons, Increased muscle spasms, Impaired flexibility, Improper body mechanics, Pain  Visit Diagnosis: Stiffness of right knee, not elsewhere classified  Muscle weakness (generalized)  Localized edema  Difficulty in walking, not elsewhere classified     Problem List There are no active problems to display for this patient.  Verne CarrowMacy Lynniah Janoski PT, DPT 3:54 PM, 07/28/18 [phone]  [facility] [address] , [place], [address] Phone: [phone]   Fax:  [phone]  Name: Angela Larsen MRN: [id number] Date of Birth: 01/27/1951

## 2018-07-31 ENCOUNTER — Ambulatory Visit (HOSPITAL_COMMUNITY): Payer: Medicare Other

## 2018-08-01 ENCOUNTER — Ambulatory Visit (HOSPITAL_COMMUNITY): Payer: Medicare Other

## 2018-08-01 ENCOUNTER — Encounter (HOSPITAL_COMMUNITY): Payer: Self-pay

## 2018-08-01 DIAGNOSIS — M25661 Stiffness of right knee, not elsewhere classified: Secondary | ICD-10-CM | POA: Diagnosis not present

## 2018-08-01 DIAGNOSIS — R262 Difficulty in walking, not elsewhere classified: Secondary | ICD-10-CM

## 2018-08-01 DIAGNOSIS — M6281 Muscle weakness (generalized): Secondary | ICD-10-CM

## 2018-08-01 DIAGNOSIS — R600 Localized edema: Secondary | ICD-10-CM

## 2018-08-01 NOTE — Therapy (Signed)
[place] [facility] [address] [place], [place], [address] Phone: [phone]   Fax:  [phone]  Physical Therapy Treatment  Patient Details  Name: Angela Larsen MRN: [id number] Date of Birth: 04-Jun-1951 Referring Provider (PT): Sherlyn Lick, MD   Encounter Date: 08/01/2018  PT End of Session - 08/01/18 1258    Visit Number  12    Number of Visits  19    Date for PT Re-Evaluation  08/16/18   Minireassess complete on 07/26/18   Authorization Type  UHC Medicare    Authorization Time Period  07/05/18 to 08/16/18    Authorization - Visit Number  2    Authorization - Number of Visits  10    PT Start Time  1256    PT Stop Time  1337    PT Time Calculation (min)  41 min    Activity Tolerance  Patient tolerated treatment well;No increased pain    Behavior During Therapy  [facility] for tasks assessed/performed       History reviewed. No pertinent past medical history.  History reviewed. No pertinent surgical history.  There were no vitals filed for this visit.  Subjective Assessment - 08/01/18 1259    Subjective  Pt states that she is feeling good but her knee is a little stiff today. She rode in the car a lot yesterday and thinks this is why. No pain though.     Currently in Pain?  No/denies           [facility] Adult PT Treatment/Exercise - 08/01/18 0001      Exercises   Exercises  Knee/Hip      Knee/Hip Exercises: Stretches   Active Hamstring Stretch  3 reps;30 seconds;Right    Active Hamstring Stretch Limitations  Standing on 12'' step    Knee: Self-Stretch to increase Flexion  Right    Knee: Self-Stretch Limitations  10x10" holds, standing 12" step    Piriformis Stretch  Right;3 reps;30 seconds    Piriformis Stretch Limitations  supine, pulling knee to opposite shoulder    Gastroc Stretch  Both;3 reps;30 seconds    Gastroc Stretch Limitations  slant board      Knee/Hip Exercises: Machines for Strengthening   Other Machine   fwd/retro resisted walking 30# x3RT each      Knee/Hip Exercises: Standing   SLS with Vectors  RLE x5RT 1UE assist    Gait Training  gait without AD x1 lap, cues to reduce R foot ER    Other Standing Knee Exercises  sidestep 70ft x1RT RTB    Other Standing Knee Exercises  bil tandem stance foam +head turns x15 reps each      Knee/Hip Exercises: Seated   Long Arc Quad  Right;20 reps    Long Arc Quad Weight  3 lbs.    Long [employer] Limitations  3" holds      Knee/Hip Exercises: Supine   Short Arc [employer]  Right;2 sets;10 reps    Short Arc Quad Sets Limitations  3#, 3-5" holds    Straight Leg Raises  Right;2 sets;5 reps    Knee Extension Limitations  3    Knee Flexion Limitations  118      Knee/Hip Exercises: Sidelying   Other Sidelying Knee/Hip Exercises  reverse clams RLE 2x10             PT Education - 08/01/18 1258    Education Details  exercise technique, continue HEP; gait without AD  Person(s) Educated  Patient    Methods  Explanation;Demonstration    Comprehension  Verbalized understanding;Returned demonstration       PT Short Term Goals - 07/26/18 1655      PT SHORT TERM GOAL #1   Title  Pt will have decreased R knee edema at joint line by 2cm or > to decrease pain and improve ROM.    Baseline  07/26/18: Rt knee edema at joint line 43 cm; was 45.5cm    Status  Achieved      PT SHORT TERM GOAL #2   Title  Pt will have improved R knee AROM from 5-105deg in order to maximize gait and reduce pain.    Baseline  07/26/18:  AROM 3-118 degrees (was 5-105 degrees)    Status  Achieved        PT Long Term Goals - 07/26/18 1657      PT LONG TERM GOAL #1   Title  Pt will have improved R knee AROM from 0-115deg in order to further maximize gait and stair ambulation.     Baseline  07/26/18: 3-118 degrees (was 9-96 degrees initial eval)      PT LONG TERM GOAL #2   Title  Pt will have improved MMT to at least 4/5 or better throughout in order to maximize gait and  balance.     Baseline  07/26/18: see MMT    Status  On-going      PT LONG TERM GOAL #3   Title  Pt will be able to perform bil SLS for 10sec or > without UE support to further demo improved functional strength to maximize gait on uneven ground and stair ambulation.     Baseline  07/26/18: ongoing    Status  On-going      PT LONG TERM GOAL #4   Title  Pt will have 173ft improvement in with LRAD and gait WFL in order to demo improved functional strength and mobility in order to maximize community ambulation, access, and promote return to PLOF.     Baseline  07/26/18: 522 feet with SPC was 426 ft with RW eval    Status  On-going            Plan - 08/01/18 1338    Clinical Impression Statement  Continued with established POC focusing on functional strength, balance, and gait. Began ambulation without AD; she continues to demo R foot ER, which could indicate weak hip IR and/or tight hip ER. Addressed this with reverse clams and hip ER stretching. Pt generally challenged with balance activities but overall tolerated well. She was also very challenged with SLR and was noted to have extension lag indicating continued quad weakness. She did have 1 bout of knee buckling during SLS with vectors due to RLE fatigue but pt remained standing independently. AROM 3-118deg this date. Continue as planned, progressing as able.     Rehab Potential  Good    PT Frequency  3x / week    PT Duration  6 weeks    PT Treatment/Interventions  ADLs/Self Care Home Management;Aquatic Therapy;Cryotherapy;Electrical Stimulation;Moist Heat;Ultrasound;DME Instruction;Gait training;Stair training;Functional mobility training;Therapeutic activities;Therapeutic exercise;Balance training;Neuromuscular re-education;Patient/family education;Manual techniques;Scar mobilization;Passive range of motion;Dry needling;Energy conservation;Taping;Spinal Manipulations;Joint Manipulations    PT Next Visit Plan  Continue focus on  end range ROM, hip IR strengthening; Increase focus with hip strengthening especially gluteal muscualure.  Continue gait training wiht SPC and taping PRN for edema.  Pt reports she is going shoe shopping and  possibly purchasing tape on Monday 07/31/18.  Instruct how to place tape if purchased.    PT Home Exercise Plan  eval: quad sets, heel slides; 07/20/18: SLS and TKE wiht GTB; 11/15: standing knee flex, TKE, LAQ; 07/31/18: squat, SLR, quad sets and SLS; 11/26: piriformis stretch    Consulted and Agree with Plan of Care  Patient       Patient will benefit from skilled therapeutic intervention in order to improve the following deficits and impairments:  Decreased activity tolerance, Abnormal gait, Decreased balance, Decreased endurance, Decreased mobility, Decreased range of motion, Decreased scar mobility, Decreased strength, Difficulty walking, Hypomobility, Increased edema, Increased fascial restricitons, Increased muscle spasms, Impaired flexibility, Improper body mechanics, Pain  Visit Diagnosis: Stiffness of right knee, not elsewhere classified  Muscle weakness (generalized)  Localized edema  Difficulty in walking, not elsewhere classified     Problem List There are no active problems to display for this patient.      Jac CanavanBrooke Gianna Calef PT, DPT  [place] [facility] [address] [place], [place], [address] Phone: [phone]   Fax:  [phone]  Name: Angela Larsen MRN: [id number] Date of Birth: 08/23/1951

## 2018-08-02 ENCOUNTER — Ambulatory Visit (HOSPITAL_COMMUNITY): Payer: Medicare Other

## 2018-08-07 ENCOUNTER — Telehealth (HOSPITAL_COMMUNITY): Payer: Self-pay

## 2018-08-07 NOTE — Telephone Encounter (Signed)
[facility] [place] want to come Th[place]day if anything opens up. NF 08/07/18

## 2018-08-08 ENCOUNTER — Encounter (HOSPITAL_COMMUNITY): Payer: Medicare Other

## 2018-08-09 ENCOUNTER — Encounter (HOSPITAL_COMMUNITY): Payer: Medicare Other

## 2018-08-09 ENCOUNTER — Encounter (HOSPITAL_COMMUNITY): Payer: Self-pay

## 2018-08-09 ENCOUNTER — Ambulatory Visit (HOSPITAL_COMMUNITY): Payer: Medicare Other | Attending: Orthopedic Surgery

## 2018-08-09 DIAGNOSIS — M6281 Muscle weakness (generalized): Secondary | ICD-10-CM

## 2018-08-09 DIAGNOSIS — R600 Localized edema: Secondary | ICD-10-CM | POA: Diagnosis present

## 2018-08-09 DIAGNOSIS — M25661 Stiffness of right knee, not elsewhere classified: Secondary | ICD-10-CM | POA: Diagnosis present

## 2018-08-09 DIAGNOSIS — R262 Difficulty in walking, not elsewhere classified: Secondary | ICD-10-CM | POA: Insufficient documentation

## 2018-08-09 NOTE — Therapy (Signed)
[facility] [address] [place], [place], [address] Phone: [phone]   Fax:  [phone]  Physical Therapy Treatment  Patient Details  Name: Angela Larsen MRN: [id number] Date of Birth: [birth year] Referring Provider (PT): Sherlyn Lick, MD   Encounter Date: 08/09/2018  PT End of Session - 08/09/18 1305    Visit Number  13    Number of Visits  19    Date for PT Re-Evaluation  08/16/18   Minireassess complete 07/26/18   Authorization Type  UHC Medicare    Authorization Time Period  07/05/18 to 08/16/18    Authorization - Visit Number  3    Authorization - Number of Visits  10    PT Start Time  1300    PT Stop Time  1344    PT Time Calculation (min)  44 min    Activity Tolerance  Patient tolerated treatment well;No increased pain    Behavior During Therapy  [facility] for tasks assessed/performed       History reviewed. No pertinent past medical history.  History reviewed. No pertinent surgical history.  There were no vitals filed for this visit.  Subjective Assessment - 08/09/18 1305    Subjective  PT stated she is feeling good today, reports she walked a lot yesterday at [facility] for blood work and had increased swelling.  Reports she bought new pair of shoes, needs to buy new pair of socks.  Interested in taping today, has not purchased the taping yet.      Currently in Pain?  No/denies                       [facility] Adult PT Treatment/Exercise - 08/09/18 0001      Ambulation/Gait   Ambulation Distance (Feet)  226 Feet    Assistive device  None    Gait Pattern  Step-through pattern;Decreased stance time - right;Decreased step length - left;Decreased hip/knee flexion - right;Decreased dorsiflexion - right;Antalgic;Trendelenburg    Gait Comments  Cueing for heel strike and to reduce ER Rt LE      Exercises   Exercises  Knee/Hip      Knee/Hip Exercises: Stretches   Active Hamstring Stretch  3 reps;30  seconds;Right    Active Hamstring Stretch Limitations  Standing on 12'' step    Gastroc Stretch  Both;3 reps;30 seconds    Gastroc Stretch Limitations  slant board      Knee/Hip Exercises: Machines for Strengthening   Other Machine  fwd/retro resisted walking 30# x3RT each      Knee/Hip Exercises: Standing   Heel Raises  Both;20 reps    Heel Raises Limitations  heel and toe slope    SLS with Vectors  RLE x5RT 1UE assist    Gait Training  gait without AD x1 lap, cues to reduce R foot ER    Other Standing Knee Exercises  sidestep 43ft x1RT RTB    Other Standing Knee Exercises  bil tandem stance foam +head turns x15 reps each      Knee/Hip Exercises: Seated   Long Arc Quad  Right;20 reps    Long Arc Quad Weight  3 lbs.    Long [employer] Limitations  3" holds      Knee/Hip Exercises: Supine   Short Arc [employer]  Right;2 sets;10 reps    Short [employer] Limitations  3" holds    Bridges with [employer]  Both;2 sets;10 reps  Straight Leg Raises  Right;2 sets;10 reps      Knee/Hip Exercises: Sidelying   Other Sidelying Knee/Hip Exercises  reverse clams RLE 2x10      Manual Therapy   Manual Therapy  Edema management  (Pended)     Kinesiotex  Edema  (Pended)              PT Education - 08/09/18 1358    Education Details  Educated benefits with compression hose, measmrent taken and paperwork given.    Person(s) Educated  Patient    Methods  Explanation;Handout    Comprehension  Verbalized understanding       PT Short Term Goals - 07/26/18 1655      PT SHORT TERM GOAL #1   Title  Pt will have decreased R knee edema at joint line by 2cm or > to decrease pain and improve ROM.    Baseline  07/26/18: Rt knee edema at joint line 43 cm; was 45.5cm    Status  Achieved      PT SHORT TERM GOAL #2   Title  Pt will have improved R knee AROM from 5-105deg in order to maximize gait and reduce pain.    Baseline  07/26/18:  AROM 3-118 degrees (was 5-105 degrees)    Status   Achieved        PT Long Term Goals - 07/26/18 1657      PT LONG TERM GOAL #1   Title  Pt will have improved R knee AROM from 0-115deg in order to further maximize gait and stair ambulation.     Baseline  07/26/18: 3-118 degrees (was 9-96 degrees initial eval)      PT LONG TERM GOAL #2   Title  Pt will have improved MMT to at least 4/5 or better throughout in order to maximize gait and balance.     Baseline  07/26/18: see MMT    Status  On-going      PT LONG TERM GOAL #3   Title  Pt will be able to perform bil SLS for 10sec or > without UE support to further demo improved functional strength to maximize gait on uneven ground and stair ambulation.     Baseline  07/26/18: ongoing    Status  On-going      PT LONG TERM GOAL #4   Title  Pt will have 15900ft improvement in 3MWT with LRAD and gait WFL in order to demo improved functional strength and mobility in order to maximize community ambulation, access, and promote return to PLOF.     Baseline  07/26/18: 3MWT 522 feet with SPC was 426 ft with RW eval    Status  On-going            Plan - 08/09/18 1353    Clinical Impression Statement  Continued session focus iwht established POC focus on functional strength, balance and gait.  Continued gait training without AD, continueds to demonstrate ER with Rt LE requiring cueing to improve.  Pt continues to have edema present Rt LE.  Kinesiotape technique complete to address edema and educated benefits of compression hose.  Measurements taken and paperwork given to purchase compression hose with [employer] store.  No reports of pain through session.  Pt did stated she has a pink spot at distal incision that arrived following rubbing down with towel from shower, encouraged to keep eye on spot.  No heat or opening noted.    Rehab Potential  Good    PT  Frequency  3x / week    PT Duration  6 weeks    PT Treatment/Interventions  ADLs/Self Care Home Management;Aquatic Therapy;Cryotherapy;Electrical  Stimulation;Moist Heat;Ultrasound;DME Instruction;Gait training;Stair training;Functional mobility training;Therapeutic activities;Therapeutic exercise;Balance training;Neuromuscular re-education;Patient/family education;Manual techniques;Scar mobilization;Passive range of motion;Dry needling;Energy conservation;Taping;Spinal Manipulations;Joint Manipulations    PT Next Visit Plan  Continue focus on end range ROM, hip IR strengthening; Increase focus with hip strengthening especially gluteal muscualure.  Continue gait training wiht SPC and taping PRN for edema.  Pt may purchasing tape for edema control, instruct how to place tape if purchased.    PT Home Exercise Plan  eval: quad sets, heel slides; 07/20/18: SLS and TKE wiht GTB; 11/15: standing knee flex, TKE, LAQ; 07/31/18: squat, SLR, quad sets and SLS; 11/26: piriformis stretch       Patient will benefit from skilled therapeutic intervention in order to improve the following deficits and impairments:  Decreased activity tolerance, Abnormal gait, Decreased balance, Decreased endurance, Decreased mobility, Decreased range of motion, Decreased scar mobility, Decreased strength, Difficulty walking, Hypomobility, Increased edema, Increased fascial restricitons, Increased muscle spasms, Impaired flexibility, Improper body mechanics, Pain  Visit Diagnosis: Stiffness of right knee, not elsewhere classified  Muscle weakness (generalized)  Localized edema  Difficulty in walking, not elsewhere classified     Problem List There are no active problems to display for this patient.  [address] LPTA; CBIS [phone]  Juel Burrow 08/09/2018, 6:41 PM   [facility] [address] [place], [place], [address] Phone: [phone]   Fax:  [phone]  Name: LELA MURFIN MRN: [id number] Date of Birth: 12-Nov-1950

## 2018-08-10 ENCOUNTER — Encounter (HOSPITAL_COMMUNITY): Payer: Self-pay

## 2018-08-10 ENCOUNTER — Ambulatory Visit (HOSPITAL_COMMUNITY): Payer: Medicare Other

## 2018-08-10 ENCOUNTER — Encounter

## 2018-08-10 DIAGNOSIS — R600 Localized edema: Secondary | ICD-10-CM

## 2018-08-10 DIAGNOSIS — M25661 Stiffness of right knee, not elsewhere classified: Secondary | ICD-10-CM | POA: Diagnosis not present

## 2018-08-10 DIAGNOSIS — R262 Difficulty in walking, not elsewhere classified: Secondary | ICD-10-CM

## 2018-08-10 DIAGNOSIS — M6281 Muscle weakness (generalized): Secondary | ICD-10-CM

## 2018-08-10 NOTE — Therapy (Signed)
[facility] [address] [place], [place], [address] Phone: [phone]   Fax:  [phone]  Physical Therapy Treatment  Patient Details  Name: Angela Larsen MRN: [id number] Date of Birth: 03/11/1951 Referring Provider (PT): Sherlyn Lickrew Henderson, MD   Encounter Date: 08/10/2018  PT End of Session - 08/10/18 1035    Visit Number  14    Number of Visits  19    Date for PT Re-Evaluation  08/16/18   MInireassess complete 07/26/18   Authorization Type  UHC Medicare    Authorization Time Period  07/05/18 to 08/16/18    Authorization - Visit Number  4    Authorization - Number of Visits  10    PT Start Time  1028    PT Stop Time  1115    PT Time Calculation (min)  47 min    Activity Tolerance  Patient tolerated treatment well;No increased pain    Behavior During Therapy  [facility] for tasks assessed/performed       History reviewed. No pertinent past medical history.  History reviewed. No pertinent surgical history.  There were no vitals filed for this visit.  Subjective Assessment - 08/10/18 1034    Subjective  Pt is feeling good today, she arrived wearing new pair of tennis shoes.      Currently in Pain?  No/denies                       [facility] Adult PT Treatment/Exercise - 08/10/18 0001      Ambulation/Gait   Ambulation Distance (Feet)  226 Feet    Assistive device  None    Gait Pattern  Step-through pattern;Decreased stance time - right;Decreased step length - left;Decreased hip/knee flexion - right;Decreased dorsiflexion - right;Antalgic;Trendelenburg    Gait Comments  Cueing for heel strike and to reduce ER Rt LE      Exercises   Exercises  Knee/Hip      Knee/Hip Exercises: Machines for Strengthening   Hip Cybex  bodycraft leg press 2plates 2x 15    Other Machine  fwd/retro resisted walking 30# x5RT each      Knee/Hip Exercises: Standing   Heel Raises  Both;20 reps    Heel Raises Limitations  heel and toe slope    Functional Squat  10 reps    Functional Squat Limitations  minisquats front of chair    SLS with Vectors  BLE x5RT 1UE assist    Gait Training  gait without AD x1 lap, cues to reduce R foot ER    Other Standing Knee Exercises  sidestep 5915ft x1RT RTB    Other Standing Knee Exercises  bil tandem stance foam +head turns x15 reps each      Knee/Hip Exercises: Seated   Other Seated Knee/Hip Exercises  IR with GTB 10x 3" holds    Other Seated Knee/Hip Exercises  isometric IR 10x 3" holds    Sit to Sand  10 reps;without UE support      Knee/Hip Exercises: Supine   Short Arc Quad Sets  Right;2 sets;10 reps    Short Arc Quad Sets Limitations  3" holds    Straight Leg Raises  Right;2 sets;10 reps      Knee/Hip Exercises: Sidelying   Other Sidelying Knee/Hip Exercises  reverse clams RLE 2x10               PT Short Term Goals - 07/26/18 1655      PT  SHORT TERM GOAL #1   Title  Pt will have decreased R knee edema at joint line by 2cm or > to decrease pain and improve ROM.    Baseline  07/26/18: Rt knee edema at joint line 43 cm; was 45.5cm    Status  Achieved      PT SHORT TERM GOAL #2   Title  Pt will have improved R knee AROM from 5-105deg in order to maximize gait and reduce pain.    Baseline  07/26/18:  AROM 3-118 degrees (was 5-105 degrees)    Status  Achieved        PT Long Term Goals - 07/26/18 1657      PT LONG TERM GOAL #1   Title  Pt will have improved R knee AROM from 0-115deg in order to further maximize gait and stair ambulation.     Baseline  07/26/18: 3-118 degrees (was 9-96 degrees initial eval)      PT LONG TERM GOAL #2   Title  Pt will have improved MMT to at least 4/5 or better throughout in order to maximize gait and balance.     Baseline  07/26/18: see MMT    Status  On-going      PT LONG TERM GOAL #3   Title  Pt will be able to perform bil SLS for 10sec or > without UE support to further demo improved functional strength to maximize gait on uneven  ground and stair ambulation.     Baseline  07/26/18: ongoing    Status  On-going      PT LONG TERM GOAL #4   Title  Pt will have 168ft improvement in with LRAD and gait WFL in order to demo improved functional strength and mobility in order to maximize community ambulation, access, and promote return to PLOF.     Baseline  07/26/18: 522 feet with SPC was 426 ft with RW eval    Status  On-going            Plan - 08/10/18 1243    Clinical Impression Statement  Continued session focus with established POC focus on functional strengthening, balalnce and improving gait mechanics without AD.  Added IR strengthening exercises as well as leg press for LE strengthening.  Min cueing for proper form to reduce compensation.  No reports of pain through session.  Tape still on LE so no manual complete today, pt stated she plans to call about the compression hose later today.      Rehab Potential  Good    PT Frequency  3x / week    PT Duration  6 weeks    PT Treatment/Interventions  ADLs/Self Care Home Management;Aquatic Therapy;Cryotherapy;Electrical Stimulation;Moist Heat;Ultrasound;DME Instruction;Gait training;Stair training;Functional mobility training;Therapeutic activities;Therapeutic exercise;Balance training;Neuromuscular re-education;Patient/family education;Manual techniques;Scar mobilization;Passive range of motion;Dry needling;Energy conservation;Taping;Spinal Manipulations;Joint Manipulations    PT Next Visit Plan  Continue focus on end range ROM, hip IR strengthening; Increase focus with hip strengthening especially gluteal muscualure.  Continue gait training without AD and taping PRN for edema.  Pt may purchasing tape for edema control, instruct how to place tape if purchased.    PT Home Exercise Plan  eval: quad sets, heel slides; 07/20/18: SLS and TKE wiht GTB; 11/15: standing knee flex, TKE, LAQ; 07/31/18: squat, SLR, quad sets and SLS; 11/26: piriformis stretch       Patient  will benefit from skilled therapeutic intervention in order to improve the following deficits and impairments:  Decreased activity tolerance, Abnormal  gait, Decreased balance, Decreased endurance, Decreased mobility, Decreased range of motion, Decreased scar mobility, Decreased strength, Difficulty walking, Hypomobility, Increased edema, Increased fascial restricitons, Increased muscle spasms, Impaired flexibility, Improper body mechanics, Pain  Visit Diagnosis: Stiffness of right knee, not elsewhere classified  Muscle weakness (generalized)  Localized edema  Difficulty in walking, not elsewhere classified     Problem List There are no active problems to display for this patient.  [address] LPTA; CBIS [phone]  Juel Burrow 08/10/2018, 12:46 PM  [facility] [address] [place], [place], [address] Phone: [phone]   Fax:  [phone]  Name: Angela Larsen MRN: [id number] Date of Birth: [birth year]

## 2018-08-11 ENCOUNTER — Ambulatory Visit (HOSPITAL_COMMUNITY): Payer: Medicare Other

## 2018-08-11 ENCOUNTER — Encounter (HOSPITAL_COMMUNITY): Payer: Medicare Other

## 2018-08-11 ENCOUNTER — Telehealth (HOSPITAL_COMMUNITY): Payer: Self-pay | Admitting: *Deleted

## 2018-08-11 NOTE — Telephone Encounter (Signed)
08/11/18  pt cx having vomiting and diarrhea

## 2018-08-14 ENCOUNTER — Ambulatory Visit (HOSPITAL_COMMUNITY): Payer: Medicare Other

## 2018-08-14 ENCOUNTER — Encounter (HOSPITAL_COMMUNITY): Payer: Medicare Other

## 2018-08-14 ENCOUNTER — Encounter (HOSPITAL_COMMUNITY): Payer: Self-pay

## 2018-08-14 DIAGNOSIS — R600 Localized edema: Secondary | ICD-10-CM

## 2018-08-14 DIAGNOSIS — M25661 Stiffness of right knee, not elsewhere classified: Secondary | ICD-10-CM

## 2018-08-14 DIAGNOSIS — M6281 Muscle weakness (generalized): Secondary | ICD-10-CM

## 2018-08-14 DIAGNOSIS — R262 Difficulty in walking, not elsewhere classified: Secondary | ICD-10-CM

## 2018-08-14 NOTE — Therapy (Signed)
[place] [facility] [address] [place], [place], [address] Phone: [phone]   Fax:  [phone]  Physical Therapy Treatment  Patient Details  Name: Angela Larsen MRN: [id number] Date of Birth: 08/15/1951 Referring Provider (PT): Sherlyn Lick, MD   Encounter Date: 08/14/2018  PT End of Session - 08/14/18 1303    Visit Number  15    Number of Visits  19    Date for PT Re-Evaluation  08/16/18   MInireassess complete 07/26/18   Authorization Type  UHC Medicare    Authorization Time Period  07/05/18 to 08/16/18    Authorization - Visit Number  5    Authorization - Number of Visits  10    PT Start Time  1259    PT Stop Time  1339    PT Time Calculation (min)  40 min    Activity Tolerance  Patient tolerated treatment well;No increased pain    Behavior During Therapy  [facility] for tasks assessed/performed       History reviewed. No pertinent past medical history.  History reviewed. No pertinent surgical history.  There were no vitals filed for this visit.  Subjective Assessment - 08/14/18 1304    Subjective  Pt states that her R leg from her knee down is swollen today which she attributes to the weather. She states that this happened prior to her surgery. She called the office about the compresison garment but she hasn't heard back from them yet.     Currently in Pain?  No/denies          [facility] Adult PT Treatment/Exercise - 08/14/18 0001      Exercises   Exercises  Knee/Hip      Knee/Hip Exercises: Stretches   Active Hamstring Stretch  3 reps;30 seconds;Right    Active Hamstring Stretch Limitations  Standing on 12'' step    Knee: Self-Stretch to increase Flexion  Right    Knee: Self-Stretch Limitations  10x10" holds, standing 12" step    Gastroc Stretch  Both;3 reps;30 seconds    Gastroc Stretch Limitations  slant board      Knee/Hip Exercises: Machines for Strengthening   Cybex Knee Extension  10#, 2x10 reps, BLE    Cybex Knee  Flexion  5 plates, [id number], BLE    Hip Cybex  bodycraft leg press 3plates 2x 15    Other Machine  fwd/retro resisted walking 40# x5RT each      Knee/Hip Exercises: Standing   Heel Raises  Both;20 reps    Heel Raises Limitations  heel and toe slope    SLS with Vectors  x3RT 1 UE assist      Knee/Hip Exercises: Seated   Stool Scoot - Round Trips  x2 fwd/retro RLE only      Knee/Hip Exercises: Supine   Knee Extension Limitations  2    Knee Flexion Limitations  118      Manual Therapy   Manual Therapy  Edema management    Manual therapy comments  separate rest of treatment    Edema Management  retro massage BLE elevated for reduced edema             PT Education - 08/14/18 1304    Education Details  exercise technique, continue HEP    Person(s) Educated  Patient    Methods  Explanation;Demonstration    Comprehension  Verbalized understanding;Returned demonstration       PT Short Term Goals - 07/26/18 1655  PT SHORT TERM GOAL #1   Title  Pt will have decreased R knee edema at joint line by 2cm or > to decrease pain and improve ROM.    Baseline  07/26/18: Rt knee edema at joint line 43 cm; was 45.5cm    Status  Achieved      PT SHORT TERM GOAL #2   Title  Pt will have improved R knee AROM from 5-105deg in order to maximize gait and reduce pain.    Baseline  07/26/18:  AROM 3-118 degrees (was 5-105 degrees)    Status  Achieved        PT Long Term Goals - 07/26/18 1657      PT LONG TERM GOAL #1   Title  Pt will have improved R knee AROM from 0-115deg in order to further maximize gait and stair ambulation.     Baseline  07/26/18: 3-118 degrees (was 9-96 degrees initial eval)      PT LONG TERM GOAL #2   Title  Pt will have improved MMT to at least 4/5 or better throughout in order to maximize gait and balance.     Baseline  07/26/18: see MMT    Status  On-going      PT LONG TERM GOAL #3   Title  Pt will be able to perform bil SLS for 10sec or > without UE  support to further demo improved functional strength to maximize gait on uneven ground and stair ambulation.     Baseline  07/26/18: ongoing    Status  On-going      PT LONG TERM GOAL #4   Title  Pt will have 156ft improvement in with LRAD and gait WFL in order to demo improved functional strength and mobility in order to maximize community ambulation, access, and promote return to PLOF.     Baseline  07/26/18: 522 feet with SPC was 426 ft with RW eval    Status  On-going            Plan - 08/14/18 1341    Clinical Impression Statement  Continued with established POC focusing on functional strength, balance, and manual to reduce edema. Added knee extension and flexion machines this date for strength and she tolerated well. She has not yet heard back from the [place] compression garment clinic but she said that she will continue to f/u on this. Pt continues to be challenged with RLE balance. Ended with manual for edema. AROM 2-118deg. Continue as planned, progressing as able    Rehab Potential  Good    PT Frequency  3x / week    PT Duration  6 weeks    PT Treatment/Interventions  ADLs/Self Care Home Management;Aquatic Therapy;Cryotherapy;Electrical Stimulation;Moist Heat;Ultrasound;DME Instruction;Gait training;Stair training;Functional mobility training;Therapeutic activities;Therapeutic exercise;Balance training;Neuromuscular re-education;Patient/family education;Manual techniques;Scar mobilization;Passive range of motion;Dry needling;Energy conservation;Taping;Spinal Manipulations;Joint Manipulations    PT Next Visit Plan  Continue focus on end range ROM, hip IR strengthening; Increase focus with hip strengthening especially gluteal muscualure.  Continue gait training without AD and taping PRN for edema.  Pt may purchasing tape for edema control, instruct how to place tape if purchased.    PT Home Exercise Plan  eval: quad sets, heel slides; 07/20/18: SLS and TKE wiht GTB; 11/15:  standing knee flex, TKE, LAQ; 07/31/18: squat, SLR, quad sets and SLS; 11/26: piriformis stretch    Consulted and Agree with Plan of Care  Patient       Patient will benefit from skilled therapeutic  intervention in order to improve the following deficits and impairments:  Decreased activity tolerance, Abnormal gait, Decreased balance, Decreased endurance, Decreased mobility, Decreased range of motion, Decreased scar mobility, Decreased strength, Difficulty walking, Hypomobility, Increased edema, Increased fascial restricitons, Increased muscle spasms, Impaired flexibility, Improper body mechanics, Pain  Visit Diagnosis: Stiffness of right knee, not elsewhere classified  Muscle weakness (generalized)  Localized edema  Difficulty in walking, not elsewhere classified     Problem List There are no active problems to display for this patient.       Jac CanavanBrooke Powell PT, DPT  [place] [facility] [address] [place], [place], [address] Phone: [phone]   Fax:  [phone]  Name: Angela Larsen MRN: [id number] Date of Birth: 08/02/1951

## 2018-08-17 ENCOUNTER — Encounter (HOSPITAL_COMMUNITY): Payer: Medicare Other

## 2018-08-17 ENCOUNTER — Ambulatory Visit (HOSPITAL_COMMUNITY): Payer: Medicare Other

## 2018-08-17 ENCOUNTER — Telehealth (HOSPITAL_COMMUNITY): Payer: Self-pay

## 2018-08-17 NOTE — Telephone Encounter (Signed)
No show #1; PT called and spoke to pt's husband regarding her missed appointment. He stated that she was really sore following the last session and then [place] went to [place] today and didn't make it back in time but just forgot to call and cancel the appointment. He stated they planned on being here at her next appointment.   Angela CanavanBrooke Yuktha Larsen PT, DPT

## 2018-08-22 ENCOUNTER — Telehealth (HOSPITAL_COMMUNITY): Payer: Self-pay

## 2018-08-22 ENCOUNTER — Ambulatory Visit (HOSPITAL_COMMUNITY): Payer: Medicare Other

## 2018-08-22 ENCOUNTER — Encounter (HOSPITAL_COMMUNITY): Payer: Medicare Other

## 2018-08-22 NOTE — Telephone Encounter (Signed)
Patient cancel for today she need to take her husband to the MD to have his hip checked

## 2018-08-24 ENCOUNTER — Encounter (HOSPITAL_COMMUNITY): Payer: Medicare Other

## 2018-08-24 ENCOUNTER — Encounter (HOSPITAL_COMMUNITY): Payer: Self-pay

## 2018-08-24 ENCOUNTER — Ambulatory Visit (HOSPITAL_COMMUNITY): Payer: Medicare Other

## 2018-08-24 DIAGNOSIS — M25661 Stiffness of right knee, not elsewhere classified: Secondary | ICD-10-CM

## 2018-08-24 DIAGNOSIS — R600 Localized edema: Secondary | ICD-10-CM

## 2018-08-24 DIAGNOSIS — R262 Difficulty in walking, not elsewhere classified: Secondary | ICD-10-CM

## 2018-08-24 DIAGNOSIS — M6281 Muscle weakness (generalized): Secondary | ICD-10-CM

## 2018-08-24 NOTE — Therapy (Signed)
[place], [place], [address] Phone: [phone]   Fax:  [phone]   Progress Note Reporting Period 07/26/18 to 08/24/18  See note below for Objective Data and Assessment of Progress/Goals.    Physical Therapy Treatment  Patient Details  Name: Angela Larsen MRN: [id number] Date of Birth: 04/28/1951 Referring Provider (PT): Carlus Pavlov, MD   Encounter Date: 08/24/2018  PT End of Session - 08/24/18 1256    Visit Number  16    Number of Visits  27   reassessment complete visit #16, 08/24/18   Date for PT Re-Evaluation  09/21/18   MInireassess complete 07/26/18   Authorization Type  [employer] Medicare    Authorization Time Period  07/05/18 to 08/16/18; NEW: 08/24/18 TO 09/21/18    Authorization - Visit Number  16    Authorization - Number of Visits  27    PT Start Time  5053    PT Stop Time  1339    PT Time Calculation (min)  41 min    Activity Tolerance  Patient tolerated treatment well;No increased pain    Behavior During Therapy  [facility] for tasks assessed/performed       History reviewed. No pertinent past medical history.  History reviewed. No pertinent surgical history.  There were no vitals filed for this visit.  Subjective Assessment - 08/24/18 1258    Subjective  Pt states that ever since the machines, she has been having to use her SPC. She states it was the one where she kicked her knee straight. She states it feels like it pulled the mm or something. She states that she called her surgeon but he is out of the office until the new year and his nurse told her to keep doing her exercises and icing it.    Currently in Pain?  Yes    Pain Score  1     Pain Location  Knee    Pain Orientation  Right    Pain Descriptors / Indicators  Aching    Pain Type  Surgical pain    Pain Onset  More than a month ago    Pain Frequency  Intermittent         [facility] PT Assessment - 08/24/18 0001      Assessment   Medical Diagnosis  R TKA    Referring Provider (PT)  Carlus Pavlov, MD    Onset Date/Surgical Date  06/06/18    Next MD Visit  09/08/2018    Prior Therapy  HHPT ended on 06/30/18      Circumferential Edema   Circumferential - Right  --   was 43cm     AROM   Right Knee Extension  6   was 3   Right Knee Flexion  115   was 118     Strength   Right Hip Flexion  3+/5   was 3   Right Hip Extension  3-/5   was 3-   Right Hip ABduction  3/5   was 3   Left Hip Flexion  4-/5   was 4-   Left Hip Extension  3-/5   was 3-   Left Hip ABduction  4-/5   was 4-   Right Knee Flexion  4-/5   was 4   Right Knee Extension  4-/5   was 3+   Left Knee Extension  4+/5   was 4   Right Ankle Dorsiflexion  4+/5   was  4     Ambulation/Gait   Ambulation Distance (Feet)  440 Feet   3MWT; was 527f with SPC   Assistive device  Straight cane    Gait Pattern  Step-through pattern;Decreased stance time - right;Decreased step length - left;Decreased hip/knee flexion - right;Decreased dorsiflexion - right;Antalgic;Trendelenburg      Balance   Balance Assessed  Yes      Static Standing Balance   Static Standing - Balance Support  No upper extremity supported    Static Standing Balance -  Activities   Single Leg Stance - Right Leg;Single Leg Stance - Left Leg    Static Standing - Comment/# of Minutes  R: L:             [facility] Adult PT Treatment/Exercise - 08/24/18 0001      Knee/Hip Exercises: Stretches   QSports administrator Right;3 reps;20 seconds    Quad Stretch Limitations  prone with rope      Knee/Hip Exercises: Supine   Quad Sets  Right;10 reps    QTarget CorporationLimitations  small blue ball x5" holds    Short Arc [employer] Corporation Right;2 sets;10 reps    Short Arc Quad Sets Limitations  2 towel rolls under knee            PT Education - 08/24/18 1257    Education Details  reassessment findings    Person(s) Educated  Patient    Methods  Explanation;Demonstration    Comprehension   Verbalized understanding;Returned demonstration       PT Short Term Goals - 08/24/18 1302      PT SHORT TERM GOAL #1   Title  Pt will have decreased R knee edema at joint line by 2cm or > to decrease pain and improve ROM.    Baseline  07/26/18: Rt knee edema at joint line 43 cm; was 45.5cm    Status  Achieved      PT SHORT TERM GOAL #2   Title  Pt will have improved R knee AROM from 5-105deg in order to maximize gait and reduce pain.    Baseline  12/19: 6-115deg    Status  Partially Met        PT Long Term Goals - 08/24/18 1303      PT LONG TERM GOAL #1   Title  Pt will have improved R knee AROM from 0-115deg in order to further maximize gait and stair ambulation.     Baseline  12/19: 6-118 degrees (was 9-96 degrees initial eval)    Time  6    Status  Partially Met      PT LONG TERM GOAL #2   Title  Pt will have improved MMT to at least 4/5 or better throughout in order to maximize gait and balance.     Baseline  12/19: see MMT    Status  On-going      PT LONG TERM GOAL #3   Title  Pt will be able to perform bil SLS for 10sec or > without UE support to further demo improved functional strength to maximize gait on uneven ground and stair ambulation.     Baseline  07/26/18: ongoing    Status  On-going      PT LONG TERM GOAL #4   Title  Pt will have 1078fimprovement in 3MWT with LRAD and gait WFL in order to demo improved functional strength and mobility in order to maximize community ambulation, access, and promote return to PLOF.  Baseline  12/19: 469f, SPC    Status  On-going            Plan - 08/24/18 1354    Clinical Impression Statement  Pt returns to therapy for the first time in 9 days. She states that ever since her last session when she did the machines, she has had significantly increased R knee pain with feelings of her knee wanting to give out and the mm feeling like it was pulled. PT assessed her knee and she was noted to be hypersensitive to the R  VLO and patellar tendon. PT educated pt that her soreness is likely from the increase in quad strengthening performed that date and while mm soreness is normal following strength training, she should not have had as much pain/soreness as she did and this PT apologized for increasing her strengthening that much that day and she verbalized understanding. Pt's goals were reassessed today and she has maintained or slightly regressed in her goals this date from her last reassessment. Her AROM was 6-115deg (was 3-118 at mini reassess) and her MMT has either maintained or slightly regressed, mostly in her proximal hip mm. Pt needs continued skilled PT intervention in order reduce her acute onset of pain while further maximizing her strength, balance, and gait. Pt agreeable to this plan. Ended session with quad strengthening and manual for pain control. Pt very challenged with SAQ due to quad weakness. No pain reported at EOS. Continue as planned, progressing quad strengthening as able but cautiously.    Rehab Potential  Good    PT Frequency  2x / week    PT Duration  4 weeks    PT Treatment/Interventions  ADLs/Self Care Home Management;Aquatic Therapy;Cryotherapy;Electrical Stimulation;Moist Heat;Ultrasound;DME Instruction;Gait training;Stair training;Functional mobility training;Therapeutic activities;Therapeutic exercise;Balance training;Neuromuscular re-education;Patient/family education;Manual techniques;Scar mobilization;Passive range of motion;Dry needling;Energy conservation;Taping;Spinal Manipulations;Joint Manipulations    PT Next Visit Plan  quad strengthen but progress with caution due to significant DOMS response to machines; manual for pain, edema; Continue focus on end range ROM, hip IR strengthening; Increase focus with hip strengthening especially gluteal muscualure. Continue gait training without AD and taping PRN for edema.    PT Home Exercise Plan  eval: quad sets, heel slides; 07/20/18: SLS and  TKE wiht GTB; 11/15: standing knee flex, TKE, LAQ; 07/31/18: squat, SLR, quad sets and SLS; 11/26: piriformis stretch    Consulted and Agree with Plan of Care  Patient       Patient will benefit from skilled therapeutic intervention in order to improve the following deficits and impairments:  Decreased activity tolerance, Abnormal gait, Decreased balance, Decreased endurance, Decreased mobility, Decreased range of motion, Decreased scar mobility, Decreased strength, Difficulty walking, Hypomobility, Increased edema, Increased fascial restricitons, Increased muscle spasms, Impaired flexibility, Improper body mechanics, Pain  Visit Diagnosis: Stiffness of right knee, not elsewhere classified - Plan: PT plan of care cert/re-cert  Muscle weakness (generalized) - Plan: PT plan of care cert/re-cert  Localized edema - Plan: PT plan of care cert/re-cert  Difficulty in walking, not elsewhere classified - Plan: PT plan of care cert/re-cert     Problem List There are no active problems to display for this patient.       BGeraldine SolarPT, DMcConnelsville7934 Golf DriveSFaith NAl[place] [address]Phone: [phone]  Fax:  [phone] Name: JKASIYAH PLATTERMRN: [id number] of Birth: 110/23/1952

## 2018-09-12 ENCOUNTER — Ambulatory Visit (HOSPITAL_COMMUNITY): Payer: Medicare Other

## 2018-09-12 ENCOUNTER — Telehealth (HOSPITAL_COMMUNITY): Payer: Self-pay

## 2018-09-12 ENCOUNTER — Encounter (HOSPITAL_COMMUNITY): Payer: Self-pay

## 2018-09-12 NOTE — Telephone Encounter (Signed)
Husband called stating they had seen the MD and he states she is doing fine and be d/c from PT.Thanks to everyone for all their help and Kindness

## 2018-09-12 NOTE — Therapy (Signed)
[place], [place], [address] Phone: [phone]   Fax:  [phone]  Patient Details  Name: Angela Larsen MRN: [id number] Date of Birth: [birth year] Referring Provider:  No ref. provider found  Encounter Date: 09/12/2018    PHYSICAL THERAPY DISCHARGE SUMMARY  Visits from Start of Care: 16  Current functional level related to goals / functional outcomes: See last note   Remaining deficits: See last note   Education / Equipment: n/a  Plan: Patient agrees to discharge.  Patient goals were partially met. Patient is being discharged due to the patient's request.  ?????     Geraldine Solar PT, [place] [address] [place], [place], [address] Phone: [phone]   Fax:  [phone]

## 2018-09-14 ENCOUNTER — Encounter (HOSPITAL_COMMUNITY): Payer: Medicare Other

## 2018-09-18 ENCOUNTER — Encounter (HOSPITAL_COMMUNITY): Payer: Medicare Other

## 2018-09-21 ENCOUNTER — Encounter (HOSPITAL_COMMUNITY): Payer: Medicare Other

## 2018-09-26 ENCOUNTER — Encounter (HOSPITAL_COMMUNITY): Payer: Medicare Other

## 2018-09-28 ENCOUNTER — Encounter (HOSPITAL_COMMUNITY): Payer: Medicare Other

## 2018-10-03 ENCOUNTER — Encounter (HOSPITAL_COMMUNITY): Payer: Medicare Other

## 2018-10-05 ENCOUNTER — Encounter (HOSPITAL_COMMUNITY): Payer: Medicare Other

## 2020-07-22 ENCOUNTER — Emergency Department (HOSPITAL_COMMUNITY): Payer: Medicare PPO

## 2020-07-22 ENCOUNTER — Other Ambulatory Visit: Payer: Self-pay

## 2020-07-22 ENCOUNTER — Encounter (HOSPITAL_COMMUNITY): Payer: Self-pay | Admitting: Emergency Medicine

## 2020-07-22 ENCOUNTER — Emergency Department (HOSPITAL_COMMUNITY)
Admission: EM | Admit: 2020-07-22 | Discharge: 2020-07-22 | Disposition: A | Discharge status: Home / Self Care | Payer: Medicare PPO | Attending: Emergency Medicine | Admitting: Emergency Medicine

## 2020-07-22 DIAGNOSIS — N201 Calculus of ureter: Secondary | ICD-10-CM

## 2020-07-22 DIAGNOSIS — N200 Calculus of kidney: Secondary | ICD-10-CM | POA: Diagnosis not present

## 2020-07-22 DIAGNOSIS — R109 Unspecified abdominal pain: Secondary | ICD-10-CM | POA: Diagnosis present

## 2020-07-22 DIAGNOSIS — R030 Elevated blood-pressure reading, without diagnosis of hypertension: Secondary | ICD-10-CM

## 2020-07-22 LAB — CBC WITH DIFFERENTIAL/PLATELET
Abs Immature Granulocytes: 0.02 10*3/uL (ref 0.00–0.07)
Basophils Absolute: 0 10*3/uL (ref 0.0–0.1)
Basophils Relative: 1 %
Eosinophils Absolute: 0.2 10*3/uL (ref 0.0–0.5)
Eosinophils Relative: 2 %
HCT: 45.7 % (ref 36.0–46.0)
Hemoglobin: 14.8 g/dL (ref 12.0–15.0)
Immature Granulocytes: 0 %
Lymphocytes Relative: 13 %
Lymphs Abs: 1 10*3/uL (ref 0.7–4.0)
MCH: 30.8 pg (ref 26.0–34.0)
MCHC: 32.4 g/dL (ref 30.0–36.0)
MCV: 95 fL (ref 80.0–100.0)
Monocytes Absolute: 0.5 10*3/uL (ref 0.1–1.0)
Monocytes Relative: 6 %
Neutro Abs: 6.3 10*3/uL (ref 1.7–7.7)
Neutrophils Relative %: 78 %
Platelets: 188 10*3/uL (ref 150–400)
RBC: 4.81 MIL/uL (ref 3.87–5.11)
RDW: 13.2 % (ref 11.5–15.5)
WBC: 8 10*3/uL (ref 4.0–10.5)
nRBC: 0 % (ref 0.0–0.2)

## 2020-07-22 LAB — COMPREHENSIVE METABOLIC PANEL
ALT: 16 U/L (ref 0–44)
AST: 16 U/L (ref 15–41)
Albumin: 4.1 g/dL (ref 3.5–5.0)
Alkaline Phosphatase: 57 U/L (ref 38–126)
Anion gap: 9 (ref 5–15)
BUN: 25 mg/dL — ABNORMAL HIGH (ref 8–23)
CO2: 23 mmol/L (ref 22–32)
Calcium: 8.7 mg/dL — ABNORMAL LOW (ref 8.9–10.3)
Chloride: 106 mmol/L (ref 98–111)
Creatinine, Ser: 0.65 mg/dL (ref 0.44–1.00)
GFR, Estimated: >60 mL/min (ref >60)
Glucose, Bld: 164 mg/dL — ABNORMAL HIGH (ref 70–99)
Potassium: 3.4 mmol/L — ABNORMAL LOW (ref 3.5–5.1)
Sodium: 138 mmol/L (ref 135–145)
Total Bilirubin: 0.7 mg/dL (ref 0.3–1.2)
Total Protein: 6.7 g/dL (ref 6.5–8.1)

## 2020-07-22 LAB — LIPASE, BLOOD: Lipase: 22 U/L (ref 11–51)

## 2020-07-22 MED ORDER — ONDANSETRON HCL 4 MG PO TABS: 4.0000 mg | ORAL_TABLET | Freq: Four times a day (QID) | ORAL | 0 refills | Status: AC | PRN

## 2020-07-22 MED ORDER — HYDROCODONE-ACETAMINOPHEN 5-325 MG PO TABS: 1.0000 | ORAL_TABLET | ORAL | 0 refills | Status: AC | PRN

## 2020-07-22 MED ORDER — KETOROLAC TROMETHAMINE 30 MG/ML IJ SOLN
15.0000 mg | Freq: Once | INTRAMUSCULAR | Status: AC
  Administered 2020-07-22: 15 mg via INTRAVENOUS
  Filled 2020-07-22: qty 1

## 2020-07-22 MED ORDER — ONDANSETRON HCL 4 MG/2ML IJ SOLN
4.0000 mg | Freq: Once | INTRAMUSCULAR | Status: AC
  Administered 2020-07-22: 4 mg via INTRAVENOUS
  Filled 2020-07-22: qty 2

## 2020-07-22 MED ORDER — LACTATED RINGERS IV BOLUS
1000.0000 mL | Freq: Once | INTRAVENOUS | Status: AC
  Administered 2020-07-22: 1000 mL via INTRAVENOUS

## 2020-07-22 MED ORDER — MORPHINE SULFATE (PF) 4 MG/ML IV SOLN
4.0000 mg | Freq: Once | INTRAVENOUS | Status: AC
  Administered 2020-07-22: 4 mg via INTRAVENOUS
  Filled 2020-07-22: qty 1

## 2020-07-22 MED ORDER — TAMSULOSIN HCL 0.4 MG PO CAPS: 0.4000 mg | ORAL_CAPSULE | Freq: Every day | ORAL | 0 refills | Status: AC

## 2020-07-22 NOTE — Discharge Instructions (Signed)
Return if pain is not being adequately controlled, or if you start running a fever.  Your blood pressure was very high today.  That may be because of the amount of pain you were in.  Please have your blood pressure checked several times over the next week.  If it stays high, you may need to be on medication to control it.

## 2020-07-22 NOTE — ED Provider Notes (Signed)
[facility] EMERGENCY DEPARTMENT Provider Note   CSN: [id number] Arrival date & time: 07/22/20  0131   History Chief Complaint  Patient presents with  . Flank Pain    Angela Larsen is a 69 y.o. female.  The history is provided by the patient.  Flank Pain  She was awakened by severe right flank pain without radiation. She rates pain at 10/10. There is associated nausea and vomiting. Nothing makes the pain better, nothing makes it worse. She denies any difficulty urinating and denies constipation or diarrhea. She denies fever, chills, sweats. There was no treatment attempted at home.  History reviewed. No pertinent past medical history.  There are no problems to display for this patient.   History reviewed. No pertinent surgical history.   OB History   No obstetric history on file.     No family history on file.  Social History   Tobacco Use  . Smoking status: Not on file  Substance Use Topics  . Alcohol use: Not on file  . Drug use: Not on file    Home Medications Prior to Admission medications   Not on File    Allergies    Patient has no allergy information on record.  Review of Systems   Review of Systems  Genitourinary: Positive for flank pain.  All other systems reviewed and are negative.   Physical Exam Updated Vital Signs BP (!) 142/129   Pulse (!) 59   Temp (!) 97.5 F (36.4 C) (Rectal)   Resp 17   Ht 5\' 3"  (1.6 m)   Wt 98.4 kg   SpO2 97%   BMI 38.44 kg/m   Physical Exam Vitals and nursing note reviewed.   69 year old female, moaning in pain, but in no acute distress. Vital signs are significant for elevated blood pressure and borderline low heart rate. Oxygen saturation is 97%, which is normal. Head is normocephalic and atraumatic. PERRLA, EOMI. Oropharynx is clear. Neck is nontender and supple without adenopathy or JVD. Back is nontender in the midline. There is moderate to severe right CVA tenderness. Lungs are clear without  rales, wheezes, or rhonchi. Chest is nontender. Heart has regular rate and rhythm without murmur. Abdomen is soft, flat, nontender without masses or hepatosplenomegaly and peristalsis is hypoactive. Extremities have no cyanosis or edema, full range of motion is present. Skin is warm and dry without rash. Neurologic: Mental status is normal, cranial nerves are intact, there are no motor or sensory deficits.  ED Results / Procedures / Treatments   Labs (all labs ordered are listed, but only abnormal results are displayed) Labs Reviewed  COMPREHENSIVE METABOLIC PANEL - Abnormal; Notable for the following components:      Result Value   Potassium 3.4 (*)    Glucose, Bld 164 (*)    BUN 25 (*)    Calcium 8.7 (*)    All other components within normal limits  LIPASE, BLOOD  CBC WITH DIFFERENTIAL/PLATELET  URINALYSIS, ROUTINE W REFLEX MICROSCOPIC    EKG EKG Interpretation  Date/Time:  Tuesday July 22 2020 01:40:28 EST Ventricular Rate:  58 PR Interval:    QRS Duration: 100 QT Interval:  481 QTC Calculation: 473 R Axis:   39 Text Interpretation: Sinus rhythm Normal ECG No old tracing to compare Confirmed by [id number] (Dione Booze) on 07/22/2020 1:50:12 AM   Radiology CT Renal Stone Study  Result Date: 07/22/2020 CLINICAL DATA:  Right flank pain and back pain, sleep EXAM: CT ABDOMEN AND PELVIS WITHOUT  CONTRAST TECHNIQUE: Multidetector CT imaging of the abdomen and pelvis was performed following the standard protocol without IV contrast. COMPARISON:  None. FINDINGS: Lower chest: Atelectatic changes present in the lung bases with additional bandlike areas of opacity, likely subsegmental atelectasis or scarring. Cardiac silhouette is top-normal size. Coronary artery atherosclerosis. No sizable pericardial effusion. Hepatobiliary: No visible worrisome liver lesion within the limitations of an unenhanced CT. Smooth liver surface contour. Normal hepatic attenuation. Prior cholecystectomy.  Prominence of the biliary tree may be reflective patient age and post cholecystectomy state. Pancreas: Partial fatty replacement of the pancreas. No pancreatic ductal dilatation or surrounding inflammatory changes. Spleen: Normal in size. No concerning splenic lesions. Adrenals/Urinary Tract: Normal adrenal glands. No concerning renal mass. Asymmetric right hydroureteronephrosis to the level of a 2 mm calculus in the region of the distal right ureter with abrupt distal ureteral decompression (2/63). Additional punctate nonobstructing calculi seen bilaterally. No left urinary tract dilatation. Urinary bladder is largely decompressed at the time of exam and therefore poorly evaluated by CT imaging. Intramural fat within the bladder, a nonspecific finding though can be seen as sequela of chronic cystitis. No convincing evidence of active inflammation at this time. Stomach/Bowel: Small sliding-type hiatal hernia. Stomach and duodenum are unremarkable. No small bowel thickening or dilatation. Normal air-filled appendix in the right lower quadrant. Intramural fat within much of the colon is a nonspecific finding that can present as sequela of inflammatory bowel disease or a lipomatous body habitus. No worrisome colonic thickening or dilatation. No evidence of bowel obstruction. Vascular/Lymphatic: Atherosclerotic calcifications within the abdominal aorta and branch vessels. No aneurysm or ectasia. No enlarged abdominopelvic lymph nodes. Reproductive: Anteverted uterus.  No concerning adnexal lesions. Other: No abdominopelvic free fluid or free gas. No bowel containing hernias. Mild posterior body wall edema. Musculoskeletal: Multilevel degenerative changes are present in the imaged portions of the spine. There are multilevel compression deformities of the spine which appear chronic in nature. These include: Inferior endplate deformity T8 with 50% height loss. Pincer type deformity T10 with 50% height loss centrally.  Incomplete burst fracture superior endplate T12 with 09% height loss. Anterior wedging compression deformity superior endplate L1 with 30% height loss and superimposed Schmorl's node. Inferior endplate deformity L3 more likely related to Schmorl's node formation. No paravertebral fluid, swelling or features to suggest acuity though could correlate for point tenderness. Levocurvature of the lumbar spine, apex L3. Suspect some compensatory curvature in the thoracic levels. Bones of the pelvis are intact and congruent. No acute or suspicious osseous lesions. IMPRESSION: 1. Asymmetric right hydroureteronephrosis to the level of a 2 mm calculus in the region of the distal right ureter with abrupt distal ureteral decompression. 2. Additional punctate nonobstructing calculi seen bilaterally. 3. Intramural fat within the bladder, a nonspecific finding though can be seen as sequela of chronic cystitis. No convincing evidence of active inflammation at this time. 4. Additional intramural fat throughout much of the colon, may reflect sequela of prior inflammatory bowel disease or secondary to lipomatous body habitus. 5. Multilevel compression deformities of the spine which appear chronic in nature. No paravertebral fluid, swelling or features to suggest acuity though could correlate for point tenderness at the levels detailed above. 6. Aortic Atherosclerosis (ICD10-I70.0). Electronically Signed   By: Kreg Shropshire M.D.   On: 07/22/2020 03:08    Procedures Procedures   Medications Ordered in ED Medications  ketorolac (TORADOL) 30 MG/ML injection 15 mg (has no administration in time range)  morphine 4 MG/ML injection 4 mg (4  mg Intravenous Given 07/22/20 0221)  ondansetron (ZOFRAN) injection 4 mg (4 mg Intravenous Given 07/22/20 0221)  lactated ringers bolus 1,000 mL (1,000 mLs Intravenous New Bag/Given 07/22/20 0221)    ED Course  I have reviewed the triage vital signs and the nursing notes.  Pertinent labs &  imaging results that were available during my care of the patient were reviewed by me and considered in my medical decision making (see chart for details).  MDM Rules/Calculators/A&P Right flank pain suggestive of urolithiasis and renal colic. Differential includes pyelonephritis, cholecystitis, pancreatitis, diverticulitis. Differential includes conditions with significant morbidity and mortality. She will be given IV fluids, morphine, ondansetron and will be sent for renal stone protocol CT scan. Old records are reviewed, and she did have a renal stone protocol CT scan in 2013 showing right ureterolithiasis.  CT scan shows right hydronephrosis secondary to 2 mm distal right ureteral calculus.  Labs show borderline hypokalemia, probably secondary to emesis earlier tonight.  She had good pain relief from above-noted treatment, she is also given a dose of ketorolac and is discharged with prescriptions for tamsulosin, ondansetron, and hydrocodone-acetaminophen.  She is referred to urology for follow-up.  Return precautions discussed.  Also, recommended that she have her blood pressure rechecked over the next week.  Final Clinical Impression(s) / ED Diagnoses Final diagnoses:  Ureterolithiasis  Nephrolithiasis  Elevated blood pressure reading    Rx / DC Orders ED Discharge Orders         Ordered    tamsulosin (FLOMAX) 0.4 MG CAPS capsule  Daily        07/22/20 0441    HYDROcodone-acetaminophen (NORCO) 5-325 MG tablet  Every 4 hours PRN        07/22/20 0441    HYDROcodone-acetaminophen (NORCO) 5-325 MG tablet  Every 4 hours PRN        07/22/20 0441    ondansetron (ZOFRAN) 4 MG tablet  Every 6 hours PRN        07/22/20 0441           Dione Booze, MD 07/22/20 [phone]

## 2020-07-22 NOTE — ED Triage Notes (Signed)
[facility] - pt c/o right sided flank and back pain that woke her up from her sleep.

## 2020-07-23 MED FILL — Hydrocodone-Acetaminophen Tab 5-325 MG: ORAL | Qty: 6 | Status: AC

## 2022-04-29 IMAGING — CT CT RENAL STONE PROTOCOL
2 of 4 series · 14 of 46 positions shown, 16 images · non-contrast
Comparison: None.

CLINICAL DATA: Right flank pain and back pain, sleep

EXAM:
CT ABDOMEN AND PELVIS WITHOUT CONTRAST
TECHNIQUE: Multidetector CT imaging of the abdomen and pelvis was performed
following the standard protocol without IV contrast.

[Series 2: axial st · axial · 0.77mm/px · z∈[+222,+608]mm · 11 of 87 slices shown, 13 images]
[im 5/87  soft-tissue]
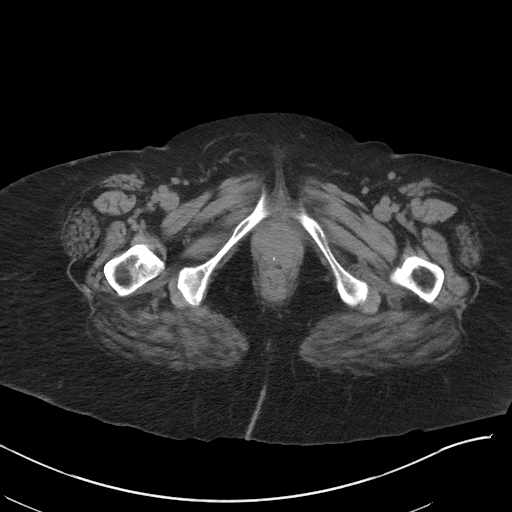
[im 5/87  bone]
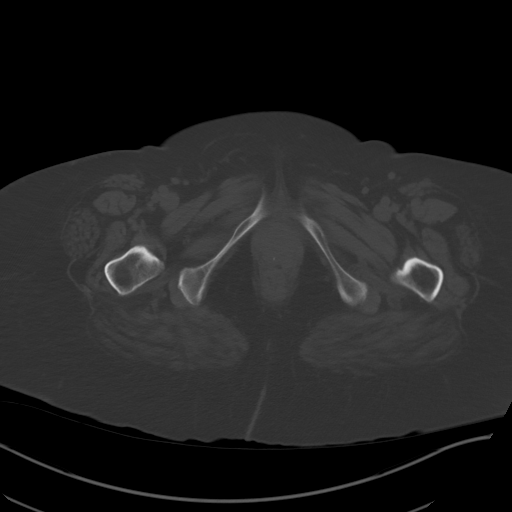
[im 15/87  soft-tissue]
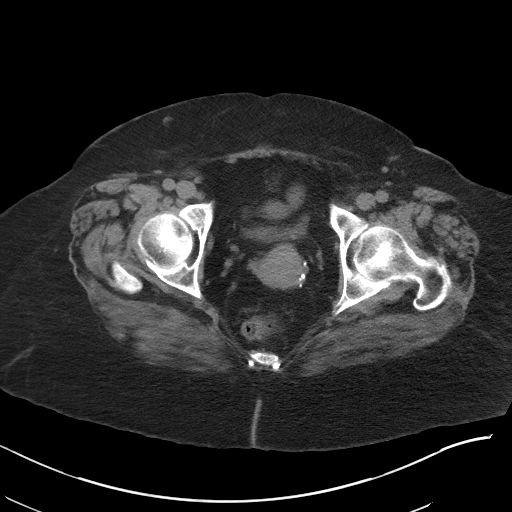
[im 20/87  soft-tissue]
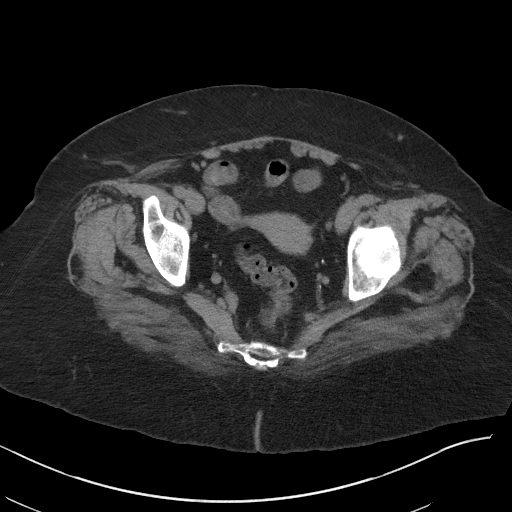
[im 29/87  soft-tissue]
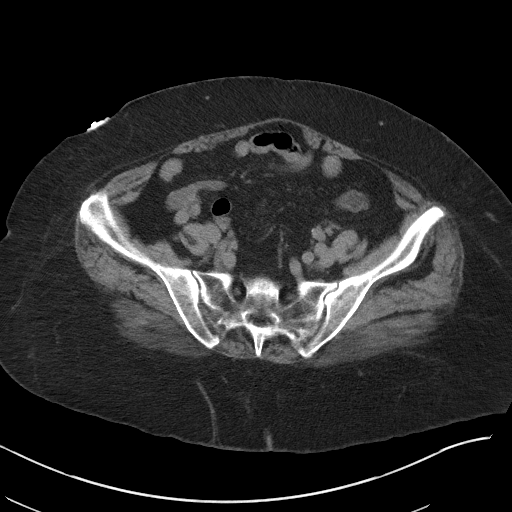
[im 34/87  soft-tissue]
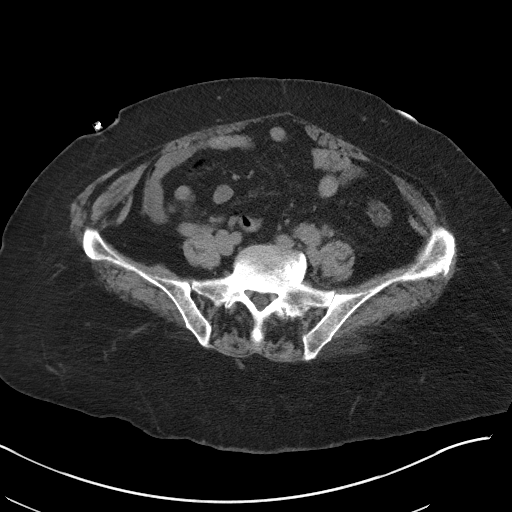
[im 44/87  soft-tissue]
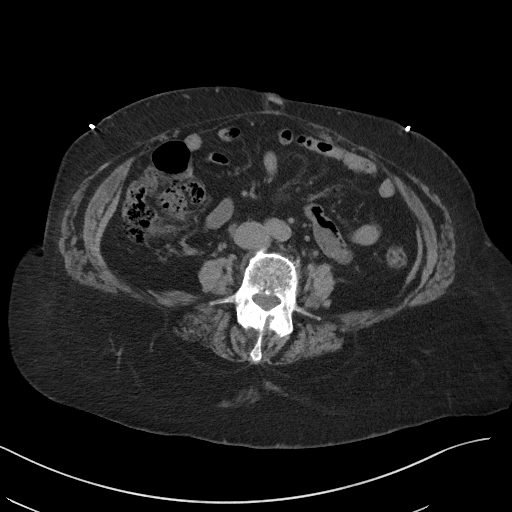
[im 53/87  soft-tissue]
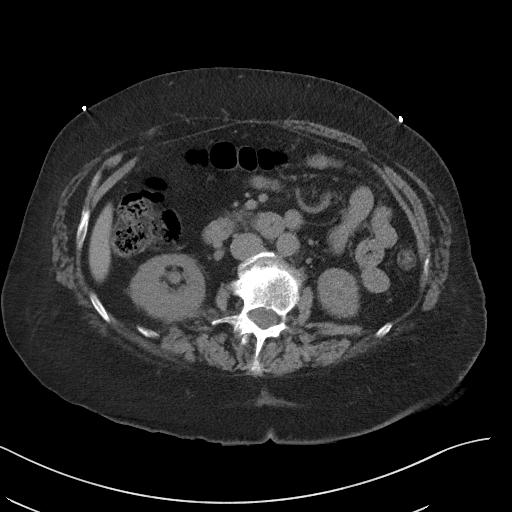
[im 58/87  soft-tissue]
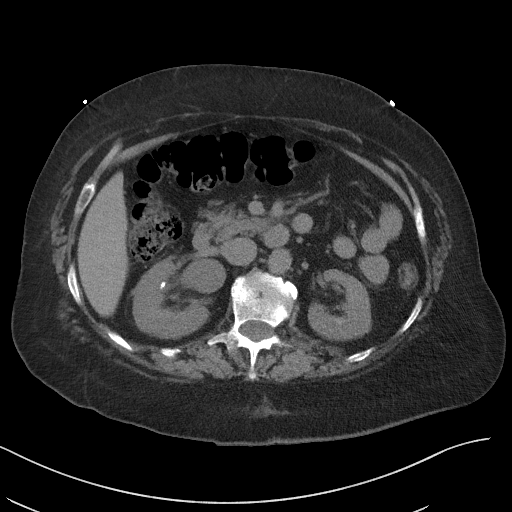
[im 67/87  soft-tissue]
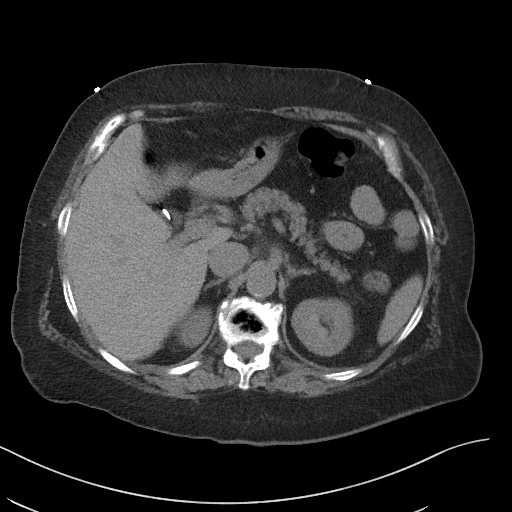
[im 67/87  bone]
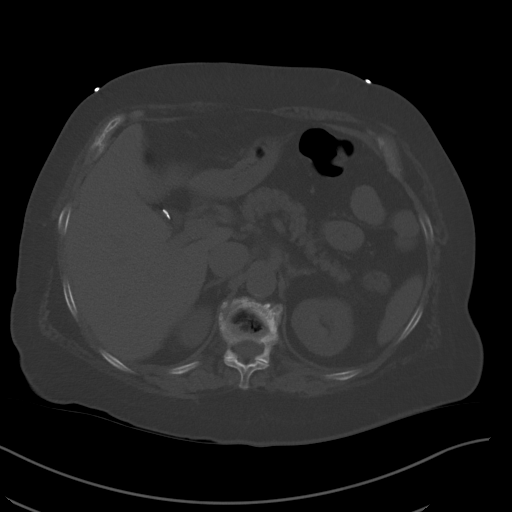
[im 72/87  soft-tissue]
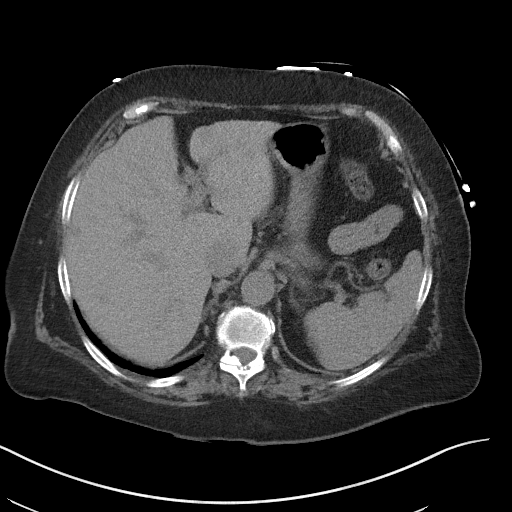
[im 82/87  soft-tissue]
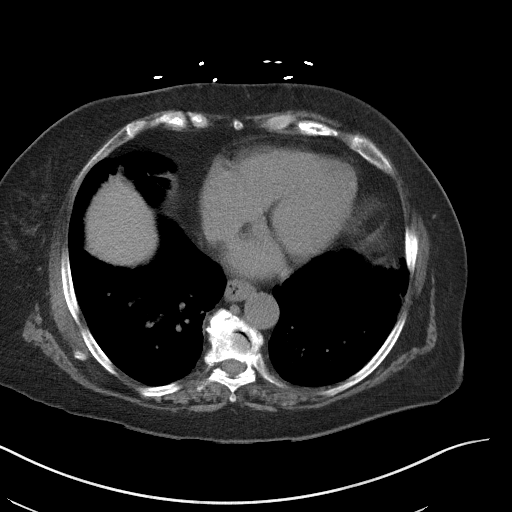

[Series 5: coronal st · coronal · 0.78mm/px · 3 of 83 slices shown]
[im 28/83  soft-tissue]
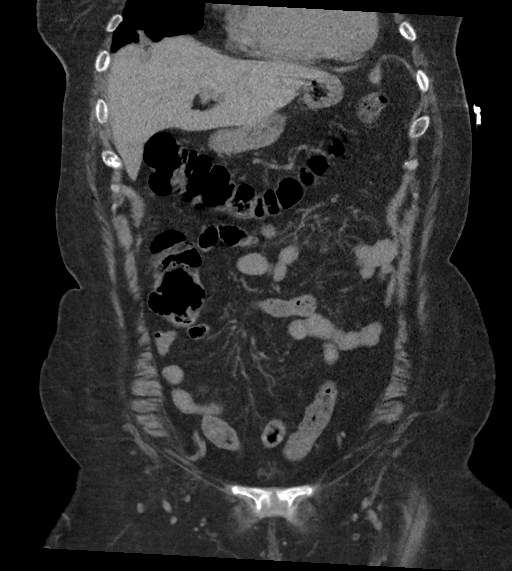
[im 37/83  soft-tissue]
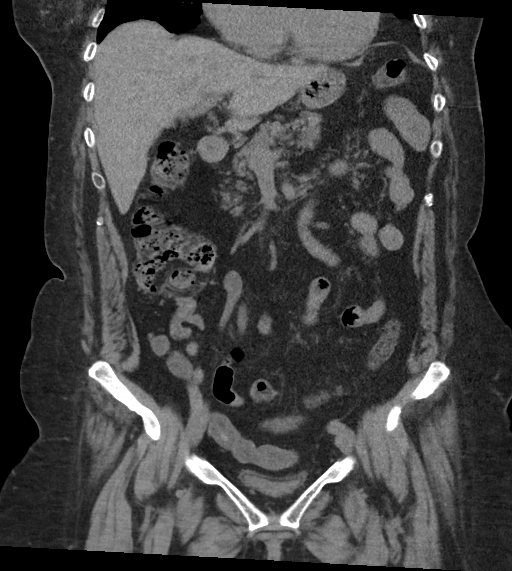
[im 46/83  soft-tissue]
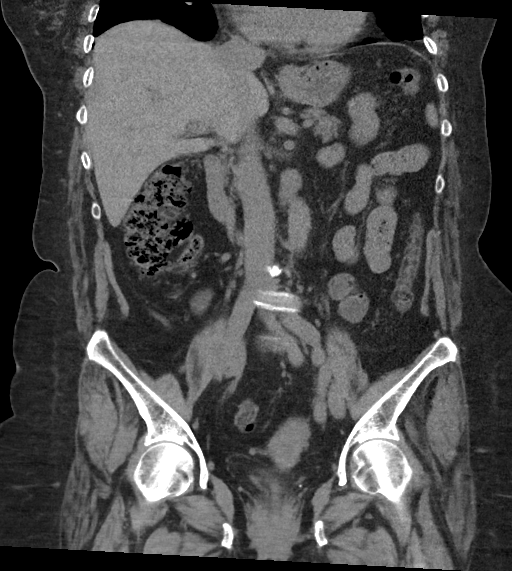

[14 of 46 positions shown; findings below may reference images not displayed]

FINDINGS: Lower chest: Atelectatic changes present in the lung bases with
additional bandlike areas of opacity, likely subsegmental
atelectasis or scarring. Cardiac silhouette is top-normal size.
Coronary artery atherosclerosis. No sizable pericardial effusion.

Hepatobiliary: No visible worrisome liver lesion within the
limitations of an unenhanced CT. Smooth liver surface contour.
Normal hepatic attenuation. Prior cholecystectomy. Prominence of the
biliary tree may be reflective patient age and post cholecystectomy
state.

Pancreas: Partial fatty replacement of the pancreas. No pancreatic
ductal dilatation or surrounding inflammatory changes.

Spleen: Normal in size. No concerning splenic lesions.

Adrenals/Urinary Tract: Normal adrenal glands. No concerning renal
mass. Asymmetric right hydroureteronephrosis to the level of a 2 mm
calculus in the region of the distal right ureter with abrupt distal
ureteral decompression (2/63). Additional punctate nonobstructing
calculi seen bilaterally. No left urinary tract dilatation. Urinary
bladder is largely decompressed at the time of exam and therefore
poorly evaluated by CT imaging. Intramural fat within the bladder, a
nonspecific finding though can be seen as sequela of chronic
cystitis. No convincing evidence of active inflammation at this
time.

Stomach/Bowel: Small sliding-type hiatal hernia. Stomach and
duodenum are unremarkable. No small bowel thickening or dilatation.
Normal air-filled appendix in the right lower quadrant. Intramural
fat within much of the colon is a nonspecific finding that can
present as sequela of inflammatory bowel disease or a lipomatous
body habitus. No worrisome colonic thickening or dilatation. No
evidence of bowel obstruction.

Vascular/Lymphatic: Atherosclerotic calcifications within the
abdominal aorta and branch vessels. No aneurysm or ectasia. No
enlarged abdominopelvic lymph nodes.

Reproductive: Anteverted uterus.  No concerning adnexal lesions.

Other: No abdominopelvic free fluid or free gas. No bowel containing
hernias. Mild posterior body wall edema.

Musculoskeletal: Multilevel degenerative changes are present in the
imaged portions of the spine. There are multilevel compression
deformities of the spine which appear chronic in nature. These
include:

Inferior endplate deformity T8 with 50% height loss.

Pincer type deformity T10 with 50% height loss centrally.

Incomplete burst fracture superior endplate T12 with 40% height
loss.

Anterior wedging compression deformity superior endplate L1 with 30%
height loss and superimposed Schmorl's node.

Inferior endplate deformity L3 more likely related to Schmorl's node
formation.

No paravertebral fluid, swelling or features to suggest acuity
though could correlate for point tenderness. Levocurvature of the
lumbar spine, apex L3. Suspect some compensatory curvature in the
thoracic levels. Bones of the pelvis are intact and congruent. No
acute or suspicious osseous lesions.
IMPRESSION: 1. Asymmetric right hydroureteronephrosis to the level of a 2 mm
calculus in the region of the distal right ureter with abrupt distal
ureteral decompression.
2. Additional punctate nonobstructing calculi seen bilaterally.
3. Intramural fat within the bladder, a nonspecific finding though
can be seen as sequela of chronic cystitis. No convincing evidence
of active inflammation at this time.
4. Additional intramural fat throughout much of the colon, may
reflect sequela of prior inflammatory bowel disease or secondary to
lipomatous body habitus.
5. Multilevel compression deformities of the spine which appear
chronic in nature. No paravertebral fluid, swelling or features to
suggest acuity though could correlate for point tenderness at the
levels detailed above.
6. Aortic Atherosclerosis ([id number]-CX6.6).
